# Patient Record
Sex: Male | Born: 1955 | Race: White | Hispanic: No | Smoking: Current every day smoker
Health system: Southern US, Community
[De-identification: ages and names within clinical notes are randomized; demographics above are authoritative.]

## PROBLEM LIST (undated history)

## (undated) DIAGNOSIS — I219 Acute myocardial infarction, unspecified: Secondary | ICD-10-CM

## (undated) DIAGNOSIS — M1990 Unspecified osteoarthritis, unspecified site: Secondary | ICD-10-CM

## (undated) DIAGNOSIS — I10 Essential (primary) hypertension: Secondary | ICD-10-CM

## (undated) DIAGNOSIS — F319 Bipolar disorder, unspecified: Secondary | ICD-10-CM

## (undated) DIAGNOSIS — E039 Hypothyroidism, unspecified: Secondary | ICD-10-CM

## (undated) HISTORY — DX: Essential (primary) hypertension: I10

## (undated) HISTORY — DX: Hypothyroidism, unspecified: E03.9

## (undated) HISTORY — DX: Acute myocardial infarction, unspecified: I21.9

---

## 2008-02-28 ENCOUNTER — Emergency Department: Payer: Self-pay | Admitting: Emergency Medicine

## 2016-01-05 ENCOUNTER — Ambulatory Visit: Payer: Self-pay

## 2016-01-05 ENCOUNTER — Encounter (INDEPENDENT_AMBULATORY_CARE_PROVIDER_SITE_OTHER): Payer: Self-pay

## 2016-01-19 ENCOUNTER — Emergency Department
Admission: EM | Admit: 2016-01-19 | Discharge: 2016-01-19 | Disposition: A | Discharge status: Home / Self Care | Payer: Self-pay | Attending: Emergency Medicine | Admitting: Emergency Medicine

## 2016-01-19 ENCOUNTER — Emergency Department: Payer: Self-pay

## 2016-01-19 ENCOUNTER — Encounter: Payer: Self-pay | Admitting: *Deleted

## 2016-01-19 DIAGNOSIS — Z7982 Long term (current) use of aspirin: Secondary | ICD-10-CM | POA: Insufficient documentation

## 2016-01-19 DIAGNOSIS — Z79899 Other long term (current) drug therapy: Secondary | ICD-10-CM | POA: Insufficient documentation

## 2016-01-19 DIAGNOSIS — F17200 Nicotine dependence, unspecified, uncomplicated: Secondary | ICD-10-CM | POA: Insufficient documentation

## 2016-01-19 DIAGNOSIS — K5900 Constipation, unspecified: Secondary | ICD-10-CM | POA: Insufficient documentation

## 2016-01-19 DIAGNOSIS — R079 Chest pain, unspecified: Secondary | ICD-10-CM | POA: Insufficient documentation

## 2016-01-19 DIAGNOSIS — Z791 Long term (current) use of non-steroidal anti-inflammatories (NSAID): Secondary | ICD-10-CM | POA: Insufficient documentation

## 2016-01-19 HISTORY — DX: Bipolar disorder, unspecified: F31.9

## 2016-01-19 HISTORY — DX: Unspecified osteoarthritis, unspecified site: M19.90

## 2016-01-19 LAB — CBC
HCT: 43.7 % (ref 40.0–52.0)
HEMOGLOBIN: 14.9 g/dL (ref 13.0–18.0)
MCH: 32.2 pg (ref 26.0–34.0)
MCHC: 34.1 g/dL (ref 32.0–36.0)
MCV: 94.6 fL (ref 80.0–100.0)
Platelets: 287 10*3/uL (ref 150–440)
RBC: 4.62 MIL/uL (ref 4.40–5.90)
RDW: 13.1 % (ref 11.5–14.5)
WBC: 7.2 10*3/uL (ref 3.8–10.6)

## 2016-01-19 LAB — BASIC METABOLIC PANEL
ANION GAP: 8 (ref 5–15)
BUN: 13 mg/dL (ref 6–20)
CALCIUM: 9.6 mg/dL (ref 8.9–10.3)
CO2: 28 mmol/L (ref 22–32)
Chloride: 103 mmol/L (ref 101–111)
Creatinine, Ser: 0.83 mg/dL (ref 0.61–1.24)
Glucose, Bld: 102 mg/dL — ABNORMAL HIGH (ref 65–99)
Potassium: 4.5 mmol/L (ref 3.5–5.1)
Sodium: 139 mmol/L (ref 135–145)

## 2016-01-19 LAB — TROPONIN I: TROPONIN I: 0.03 ng/mL (ref <0.031)

## 2016-01-19 MED ORDER — NITROGLYCERIN 0.4 MG SL SUBL
0.4000 mg | SUBLINGUAL_TABLET | SUBLINGUAL | Status: DC | PRN
  Filled 2016-01-19: qty 1

## 2016-01-19 MED ORDER — GI COCKTAIL ~~LOC~~
30.0000 mL | Freq: Once | ORAL | Status: AC
  Administered 2016-01-19: 30 mL via ORAL
  Filled 2016-01-19: qty 30

## 2016-01-19 MED ORDER — DOCUSATE SODIUM 100 MG PO CAPS: 100.0000 mg | ORAL_CAPSULE | Freq: Two times a day (BID) | ORAL | Status: AC

## 2016-01-19 MED ORDER — PANTOPRAZOLE SODIUM 20 MG PO TBEC: 20.0000 mg | DELAYED_RELEASE_TABLET | Freq: Every day | ORAL | Status: DC

## 2016-01-19 NOTE — Discharge Instructions (Signed)
You have been seen in the emergency department today for chest pain. Your workup has shown normal results. As we discussed please follow-up with your primary care physician in the next 1-2 days for recheck. Return to the emergency department for any further chest pain, trouble breathing, or any other symptom personally concerning to yourself. Please call the number provided for cardiology since possible to arrange a stress test.   Nonspecific Chest Pain It is often hard to find the cause of chest pain. There is always a chance that your pain could be related to something serious, such as a heart attack or a blood clot in your lungs. Chest pain can also be caused by conditions that are not life-threatening. If you have chest pain, it is very important to follow up with your doctor.  HOME CARE  If you were prescribed an antibiotic medicine, finish it all even if you start to feel better.  Avoid any activities that cause chest pain.  Do not use any tobacco products, including cigarettes, chewing tobacco, or electronic cigarettes. If you need help quitting, ask your doctor.  Do not drink alcohol.  Take medicines only as told by your doctor.  Keep all follow-up visits as told by your doctor. This is important. This includes any further testing if your chest pain does not go away.  Your doctor may tell you to keep your head raised (elevated) while you sleep.  Make lifestyle changes as told by your doctor. These may include:  Getting regular exercise. Ask your doctor to suggest some activities that are safe for you.  Eating a heart-healthy diet. Your doctor or a diet specialist (dietitian) can help you to learn healthy eating options.  Maintaining a healthy weight.  Managing diabetes, if necessary.  Reducing stress. GET HELP IF:  Your chest pain does not go away, even after treatment.  You have a rash with blisters on your chest.  You have a fever. GET HELP RIGHT AWAY IF:  Your  chest pain is worse.  You have an increasing cough, or you cough up blood.  You have severe belly (abdominal) pain.  You feel extremely weak.  You pass out (faint).  You have chills.  You have sudden, unexplained chest discomfort.  You have sudden, unexplained discomfort in your arms, back, neck, or jaw.  You have shortness of breath at any time.  You suddenly start to sweat, or your skin gets clammy.  You feel nauseous.  You vomit.  You suddenly feel light-headed or dizzy.  Your heart begins to beat quickly, or it feels like it is skipping beats. These symptoms may be an emergency. Do not wait to see if the symptoms will go away. Get medical help right away. Call your local emergency services (911 in the [place] Do not drive yourself to the hospital.   This information is not intended to replace advice given to you by your health care provider. Make sure you discuss any questions you have with your health care provider.   Document Released: 05/15/2008 Document Revised: 12/18/2014 Document Reviewed: 07/03/2014 Elsevier Interactive Patient Education [employer]

## 2016-01-19 NOTE — ED Provider Notes (Addendum)
[facility] Emergency Department Provider Note  Time seen: 6:18 PM  I have reviewed the triage vital signs and the nursing notes.   HISTORY  Chief Complaint Chest Pain    HPI Wayne Roy is a 60 y.o. male the past medical history of arthritis and bipolar presents the emergency department with chest discomfort. According to the patient for the past one month he has been having chest discomfort somewhat worse with exertion such as going up a flight of stairs. Patient is also complaining of some constipation, states he has been taking MiraLAX since getting out of prison approximately one month ago but continues to have constipation at times. Patient is currently working on Museum/gallery curator and does not have the money to see a cardiologist. Patient has arrange a primary care appointment with [facility]. He is back on his medications and being seen by medical management for medication refills. Describes chest discomfort as 0/10 currently, however he states upon exertion he will get moderate chest discomfort/tightness which tends to resolve with rest, sometimes within minutes, sometimes several hours. Denies any pain currently.     Past Medical History  Diagnosis Date  . Arthritis   . Bipolar 1 disorder (HCC)     There are no active problems to display for this patient.   History reviewed. No pertinent past surgical history.  Current Outpatient Rx  Name  Route  Sig  Dispense  Refill  . acetaminophen (TYLENOL) 500 MG tablet   Oral   Take 1,000 mg by mouth every 6 (six) hours as needed for mild pain or headache.         Marland Kitchen aspirin EC 81 MG tablet   Oral   Take 81 mg by mouth daily.         . busPIRone (BUSPAR) 15 MG tablet   Oral   Take 30 mg by mouth 2 (two) times daily.         . carbamazepine (TEGRETOL) 200 MG tablet   Oral   Take 400-600 mg by mouth 2 (two) times daily. Pt takes two tablets every morning and three tablets at  bedtime.         Marland Kitchen gemfibrozil (LOPID) 600 MG tablet   Oral   Take 600 mg by mouth 2 (two) times daily before a meal.         . levothyroxine (SYNTHROID, LEVOTHROID) 50 MCG tablet   Oral   Take 50 mcg by mouth daily before breakfast.         . meloxicam (MOBIC) 15 MG tablet   Oral   Take 15 mg by mouth daily.         . mirtazapine (REMERON) 30 MG tablet   Oral   Take 30 mg by mouth at bedtime.         . polyethylene glycol (MIRALAX / GLYCOLAX) packet   Oral   Take 17 g by mouth daily as needed for mild constipation.         . ranitidine (ZANTAC) 150 MG tablet   Oral   Take 300 mg by mouth 2 (two) times daily.         Marland Kitchen venlafaxine XR (EFFEXOR-XR) 150 MG 24 hr capsule   Oral   Take 300 mg by mouth daily with breakfast.           Allergies Saphris  History reviewed. No pertinent family history.  Social History Social History  Substance Use Topics  . Smoking  status: Current Every Day Smoker  . Smokeless tobacco: None  . Alcohol Use: None    Review of Systems Constitutional: Negative for fever. Cardiovascular: Positive for chest pain, currently resolved Respiratory: Negative for shortness of breath. Gastrointestinal: Negative for abdominal pain. Positive for constipation. Musculoskeletal: Negative for back pain. Neurological: Negative for headache 10-point ROS otherwise negative.  ____________________________________________   PHYSICAL EXAM:  VITAL SIGNS: ED Triage Vitals  Enc Vitals Group     BP 01/19/16 1406 163/107 mmHg     Pulse Rate 01/19/16 1406 111     Resp 01/19/16 1406 18     Temp 01/19/16 1406 98 F (36.7 C)     Temp Source 01/19/16 1406 Oral     SpO2 01/19/16 1406 96 %     Weight 01/19/16 1406 228 lb (103.42 kg)     Height 01/19/16 1406  (1.778 m)     Head Cir --      Peak Flow --      Pain Score 01/19/16 1407 7     Pain Loc --      Pain Edu? --      Excl. in GC? --     Constitutional: Alert and oriented. Well  appearing and in no distress. Eyes: Normal exam ENT   Head: Normocephalic and atraumatic.   Mouth/Throat: Mucous membranes are moist. Cardiovascular: Normal rate, regular rhythm. No murmur Respiratory: Normal respiratory effort without tachypnea nor retractions. Breath sounds are clear Gastrointestinal: Soft and nontender. No distention.  Musculoskeletal: Nontender with normal range of motion in all extremities.  Neurologic:  Normal speech and language. No gross focal neurologic deficits Skin:  Skin is warm, dry and intact.  Psychiatric: Mood and affect are normal. Speech and behavior are normal.  ____________________________________________    EKG  EKG shows sinus tachycardia 104 bpm, narrow QS, normal axis, normal intervals, no ST changes. Normal EKG besides mild tachycardia.  ____________________________________________    RADIOLOGY  Chest x-ray shows no acute abnormality  ____________________________________________    INITIAL IMPRESSION / ASSESSMENT AND PLAN / ED COURSE  Pertinent labs & imaging results that were available during my care of the patient were reviewed by me and considered in my medical decision making (see chart for details).  Patient presents with chest discomfort intermittent for the past one month. Patient's labs are within normal limits, EKG is reassuring, chest x-ray is normal. Patient states it does feel like heartburn, and since leaving prison he states he has not been on any heartburn medications. We will treat with a GI cocktail in the emergency department. He also states during my questioning that the pain is somewhat worse with exertion. I discussed with the patient I would strongly recommend he get a stress test, I will refer him to a cardiologist, he states he is expecting to get his Medicaid approved any day now and he will follow-up as soon as he does. I urged the patient to do this as soon as possible even if he does not have insurance.  Patient is currently back on HIS medications which he had been off of since being discharged from prison. Patient states he is feeling better status post GI cocktail. We'll discharge the patient with primary care follow-up at [facility], and cardiology follow-up.   EKG reviewed and interpreted by myself shows sinus tachycardia 104 bpm. Narrow QRS, normal axis, normal intervals, no ST changes. ____________________________________________   FINAL CLINICAL IMPRESSION(S) / ED DIAGNOSES  Chest pain   Minna Antis, MD 01/19/16  [id number]  Minna Antis, MD 02/04/16 [phone]

## 2016-01-19 NOTE — ED Notes (Signed)
Pt states chest tightness on and off for the past few months, states he noticed his BP was high today so he came for evaluation, pt states SOB, mid chest

## 2016-01-20 ENCOUNTER — Encounter: Payer: Self-pay | Admitting: Pharmacist

## 2016-01-20 ENCOUNTER — Ambulatory Visit: Payer: Self-pay

## 2016-01-20 DIAGNOSIS — I10 Essential (primary) hypertension: Secondary | ICD-10-CM | POA: Insufficient documentation

## 2016-01-20 DIAGNOSIS — R079 Chest pain, unspecified: Secondary | ICD-10-CM | POA: Insufficient documentation

## 2016-01-20 DIAGNOSIS — E039 Hypothyroidism, unspecified: Secondary | ICD-10-CM | POA: Insufficient documentation

## 2016-01-20 DIAGNOSIS — E785 Hyperlipidemia, unspecified: Secondary | ICD-10-CM | POA: Insufficient documentation

## 2016-01-28 DIAGNOSIS — R079 Chest pain, unspecified: Secondary | ICD-10-CM

## 2016-01-28 DIAGNOSIS — I10 Essential (primary) hypertension: Secondary | ICD-10-CM

## 2016-01-28 DIAGNOSIS — E785 Hyperlipidemia, unspecified: Secondary | ICD-10-CM

## 2016-01-28 DIAGNOSIS — E039 Hypothyroidism, unspecified: Secondary | ICD-10-CM

## 2016-02-08 ENCOUNTER — Telehealth: Payer: Self-pay

## 2016-02-08 NOTE — Telephone Encounter (Signed)
Pt called to check on paperwork for [facility] referral

## 2016-02-17 ENCOUNTER — Other Ambulatory Visit: Payer: Self-pay

## 2016-02-17 DIAGNOSIS — E039 Hypothyroidism, unspecified: Secondary | ICD-10-CM

## 2016-02-17 DIAGNOSIS — I10 Essential (primary) hypertension: Secondary | ICD-10-CM

## 2016-02-18 LAB — CBC WITH DIFFERENTIAL/PLATELET
BASOS ABS: 0 10*3/uL (ref 0.0–0.2)
Basos: 0 %
EOS (ABSOLUTE): 0.3 10*3/uL (ref 0.0–0.4)
Eos: 3 %
Hematocrit: 40.4 % (ref 37.5–51.0)
Hemoglobin: 13.9 g/dL (ref 12.6–17.7)
IMMATURE GRANULOCYTES: 0 %
Immature Grans (Abs): 0 10*3/uL (ref 0.0–0.1)
Lymphocytes Absolute: 2.1 10*3/uL (ref 0.7–3.1)
Lymphs: 23 %
MCH: 32.4 pg (ref 26.6–33.0)
MCHC: 34.4 g/dL (ref 31.5–35.7)
MCV: 94 fL (ref 79–97)
MONOS ABS: 0.6 10*3/uL (ref 0.1–0.9)
Monocytes: 7 %
NEUTROS PCT: 67 %
Neutrophils Absolute: 6.2 10*3/uL (ref 1.4–7.0)
PLATELETS: 309 10*3/uL (ref 150–379)
RBC: 4.29 x10E6/uL (ref 4.14–5.80)
RDW: 13.7 % (ref 12.3–15.4)
WBC: 9.3 10*3/uL (ref 3.4–10.8)

## 2016-02-18 LAB — COMPREHENSIVE METABOLIC PANEL
ALK PHOS: 68 IU/L (ref 39–117)
ALT: 42 IU/L (ref 0–44)
AST: 32 IU/L (ref 0–40)
Albumin/Globulin Ratio: 1.9 (ref 1.1–2.5)
Albumin: 4.7 g/dL (ref 3.5–5.5)
BUN/Creatinine Ratio: 13 (ref 9–20)
BUN: 12 mg/dL (ref 6–24)
Bilirubin Total: 0.5 mg/dL (ref 0.0–1.2)
CO2: 22 mmol/L (ref 18–29)
CREATININE: 0.96 mg/dL (ref 0.76–1.27)
Calcium: 9.4 mg/dL (ref 8.7–10.2)
Chloride: 101 mmol/L (ref 96–106)
GFR calc Af Amer: 101 mL/min/{1.73_m2} (ref >59)
GFR calc non Af Amer: 87 mL/min/{1.73_m2} (ref >59)
GLOBULIN, TOTAL: 2.5 g/dL (ref 1.5–4.5)
GLUCOSE: 105 mg/dL — AB (ref 65–99)
Potassium: 4.3 mmol/L (ref 3.5–5.2)
SODIUM: 140 mmol/L (ref 134–144)
Total Protein: 7.2 g/dL (ref 6.0–8.5)

## 2016-02-18 LAB — LIPID PANEL
CHOL/HDL RATIO: 4.1 ratio (ref 0.0–5.0)
CHOLESTEROL TOTAL: 180 mg/dL (ref 100–199)
HDL: 44 mg/dL (ref >39)
LDL Calculated: 102 mg/dL — ABNORMAL HIGH (ref 0–99)
TRIGLYCERIDES: 172 mg/dL — AB (ref 0–149)
VLDL Cholesterol Cal: 34 mg/dL (ref 5–40)

## 2016-02-18 LAB — TSH: TSH: 0.243 u[IU]/mL — AB (ref 0.450–4.500)

## 2016-02-22 NOTE — Telephone Encounter (Signed)
Tried to call pt to confirm that [facility] [phone]) referral was taken care of. Confirmed with Lorrie that referral was complete. Closing encounter.

## 2016-02-24 ENCOUNTER — Ambulatory Visit: Payer: Self-pay | Admitting: Nurse Practitioner

## 2016-02-24 VITALS — BP 122/77 | HR 95 | Ht 70.0 in | Wt 220.0 lb

## 2016-02-24 DIAGNOSIS — E038 Other specified hypothyroidism: Secondary | ICD-10-CM

## 2016-02-24 DIAGNOSIS — K219 Gastro-esophageal reflux disease without esophagitis: Secondary | ICD-10-CM

## 2016-02-24 DIAGNOSIS — M5442 Lumbago with sciatica, left side: Secondary | ICD-10-CM

## 2016-02-24 DIAGNOSIS — R7309 Other abnormal glucose: Secondary | ICD-10-CM

## 2016-02-24 MED ORDER — RANITIDINE HCL 150 MG PO TABS: 150.0000 mg | ORAL_TABLET | Freq: Two times a day (BID) | ORAL | Status: DC

## 2016-02-24 MED ORDER — FENOFIBRATE 145 MG PO TABS: 145.0000 mg | ORAL_TABLET | Freq: Every day | ORAL | Status: DC

## 2016-02-24 MED ORDER — LEVOTHYROXINE SODIUM 25 MCG PO TABS: ORAL_TABLET | ORAL | Status: DC

## 2016-02-24 NOTE — Progress Notes (Signed)
   Subjective:    Patient ID: Wayne Roy, male    DOB: 12/18/1955, 60 y.o.   MRN: [id number]  HPI rHere today for follow of hypothyroidism, last tsh was lower than acceptable limit, pt asymtpomatic.    Lower back pain, with radiation  Down L leg, prednisone causes mania in this pt r/t bipolar I, on meloxicam reports inadequate response  Gerd, protonix two tabs per day inadequate response, drinks 2 cups of coffee per day and diet mountain dew.    Last labs, blood glucose at 175, no past history of diabetes.     Review of Systems  Constitutional: Negative for fever, fatigue and unexpected weight change.  Respiratory: Positive for shortness of breath.   Gastrointestinal:       Positive for gerd  Musculoskeletal: Positive for back pain.       Objective:   Physical Exam  Constitutional: He is oriented to person, place, and time.  Cardiovascular: Regular rhythm and normal heart sounds.   Mildly tachycardic, heart rate in 90's to approx 11o No carotid bruits  Pulmonary/Chest: Effort normal and breath sounds normal. He has no wheezes. He has no rales.  Musculoskeletal: He exhibits no edema.  Neurological: He is alert and oriented to person, place, and time.  Psychiatric: His behavior is normal.          Assessment & Plan:     Wayne Roy was seen today for gastroesophageal reflux.  Diagnoses and all orders for this visit:  Other specified hypothyroidism -     TSH; Future  Other abnormal glucose -     HgB A1c; Future  Left-sided low back pain with left-sided sciatica  GERD without esophagitis  Other orders -     fenofibrate (TRICOR) 145 MG tablet; Take 1 tablet (145 mg total) by mouth daily. -     ranitidine (ZANTAC) 150 MG tablet; Take 1 tablet (150 mg total) by mouth 2 (two) times daily.

## 2016-02-25 ENCOUNTER — Telehealth: Payer: Self-pay

## 2016-02-25 NOTE — Telephone Encounter (Signed)
[facility] 25 mcg take 1.5 tablets daily quantity #45. #45 would be a 90 day supply if taking 1/2 tablet daily or it would be a 30 day supply if taking 1.5 daily. [facility] would like verification on the instructions and supply.

## 2016-03-01 ENCOUNTER — Ambulatory Visit: Payer: Self-pay | Admitting: Specialist

## 2016-03-01 DIAGNOSIS — M4807 Spinal stenosis, lumbosacral region: Secondary | ICD-10-CM

## 2016-03-17 NOTE — Progress Notes (Signed)
See paper order under media tab

## 2016-03-23 ENCOUNTER — Ambulatory Visit
Admission: RE | Admit: 2016-03-23 | Discharge: 2016-03-23 | Disposition: A | Discharge status: Home / Self Care | Payer: Self-pay | Source: Ambulatory Visit | Attending: Specialist | Admitting: Specialist

## 2016-03-23 ENCOUNTER — Other Ambulatory Visit: Payer: Self-pay

## 2016-03-23 DIAGNOSIS — M4807 Spinal stenosis, lumbosacral region: Secondary | ICD-10-CM | POA: Insufficient documentation

## 2016-03-23 DIAGNOSIS — M5136 Other intervertebral disc degeneration, lumbar region: Secondary | ICD-10-CM | POA: Insufficient documentation

## 2016-03-23 DIAGNOSIS — E038 Other specified hypothyroidism: Secondary | ICD-10-CM

## 2016-03-23 DIAGNOSIS — M1288 Other specific arthropathies, not elsewhere classified, other specified site: Secondary | ICD-10-CM | POA: Insufficient documentation

## 2016-03-23 DIAGNOSIS — R7309 Other abnormal glucose: Secondary | ICD-10-CM

## 2016-03-24 LAB — HEMOGLOBIN A1C
ESTIMATED AVERAGE GLUCOSE: 114 mg/dL
HEMOGLOBIN A1C: 5.6 % (ref 4.8–5.6)

## 2016-03-24 LAB — TSH: TSH: 0.869 u[IU]/mL (ref 0.450–4.500)

## 2016-03-29 ENCOUNTER — Encounter: Payer: Self-pay | Admitting: Specialist

## 2016-03-30 DIAGNOSIS — F17200 Nicotine dependence, unspecified, uncomplicated: Secondary | ICD-10-CM | POA: Insufficient documentation

## 2016-05-25 ENCOUNTER — Ambulatory Visit: Payer: Self-pay

## 2016-08-14 IMAGING — MR MR LUMBAR SPINE W/O CM
4 of 5 series · 15 of 48 positions shown · non-contrast
Comparison: None.

CLINICAL DATA: Low back pain extends to the LEFT leg for 10 years.
Worsening over time.

EXAM:
MRI LUMBAR SPINE WITHOUT CONTRAST
TECHNIQUE: Multiplanar, multisequence MR imaging of the lumbar spine was
performed. No intravenous contrast was administered.

[Series 2: T2 · sagittal · 4.0mm · 0.44mm/px · 6 of 16 slices shown (1 of 2)]
[im 1/16]
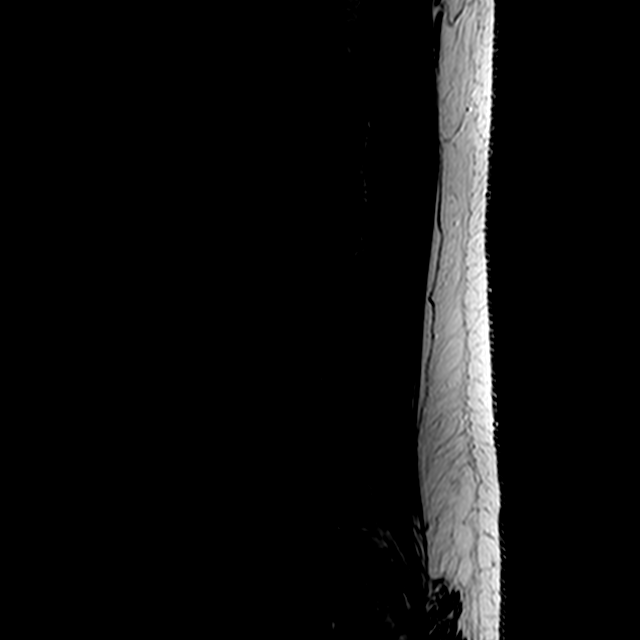
[im 4/16]
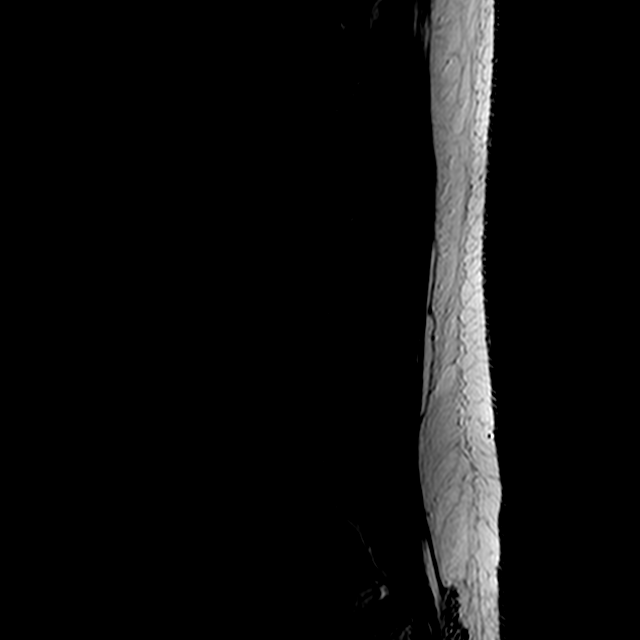
[im 7/16]
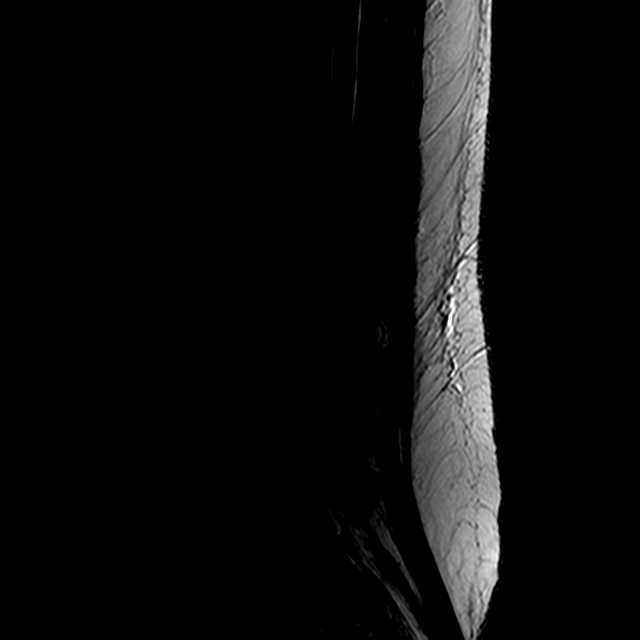
[im 10/16]
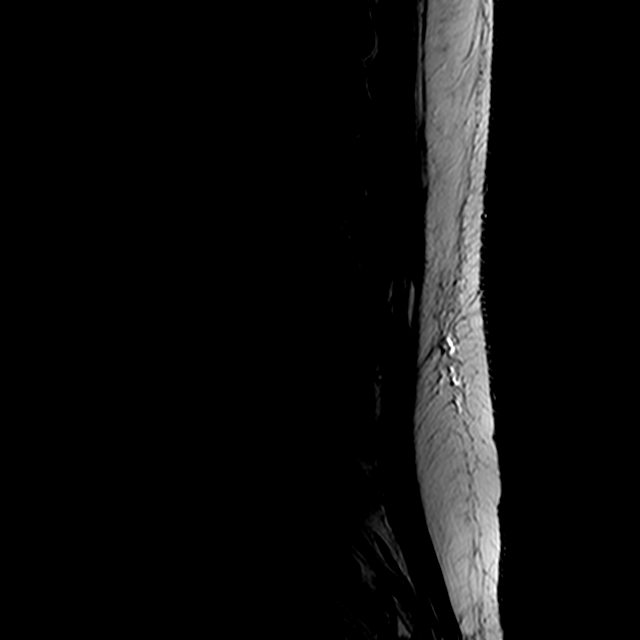
[im 13/16]
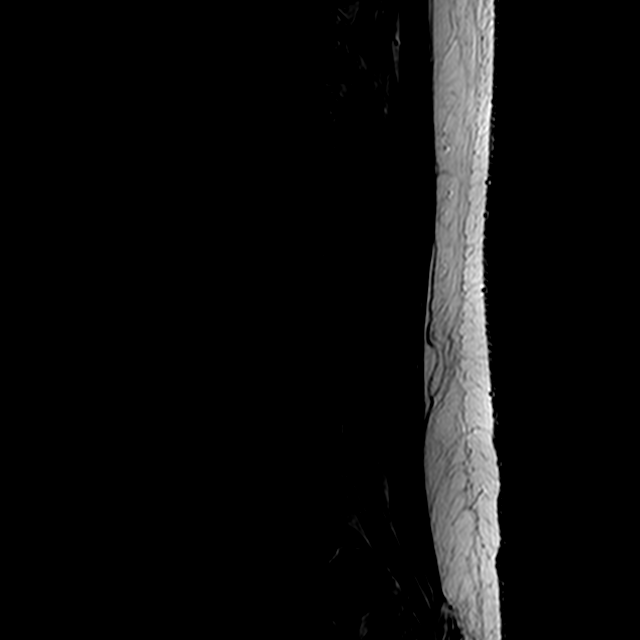
[im 16/16]
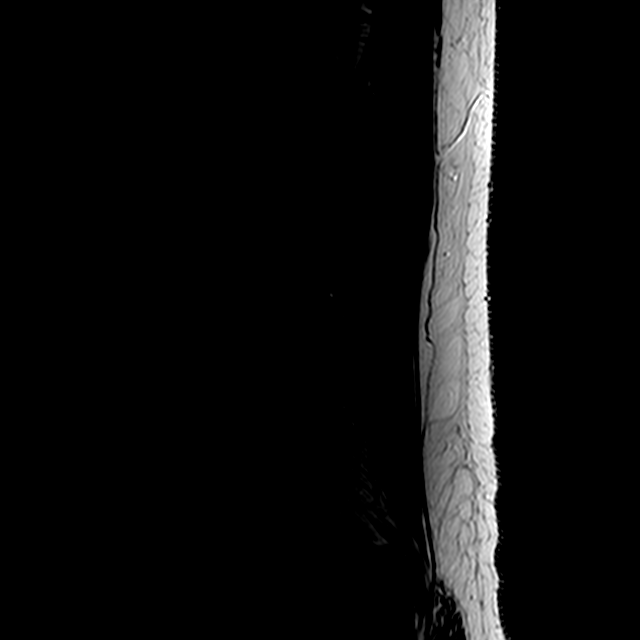

[Series 3: T1 · sagittal · 4.0mm · 0.44mm/px · 3 of 16 slices shown (1 of 2)]
[im 4/16]
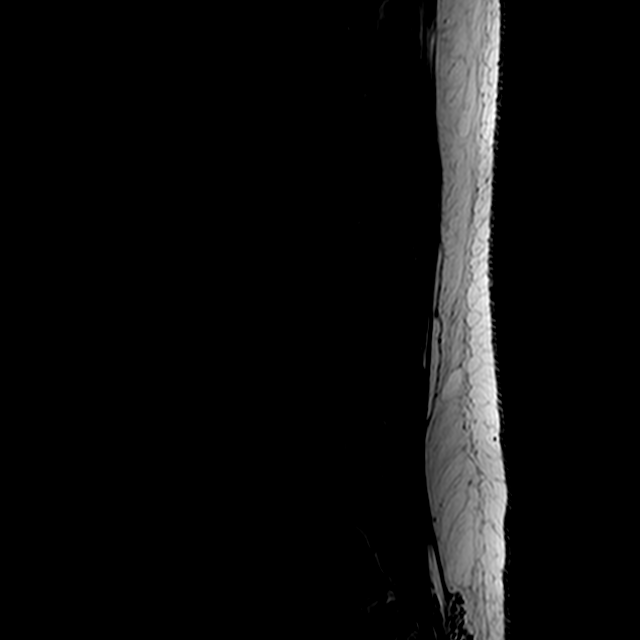
[im 10/16]
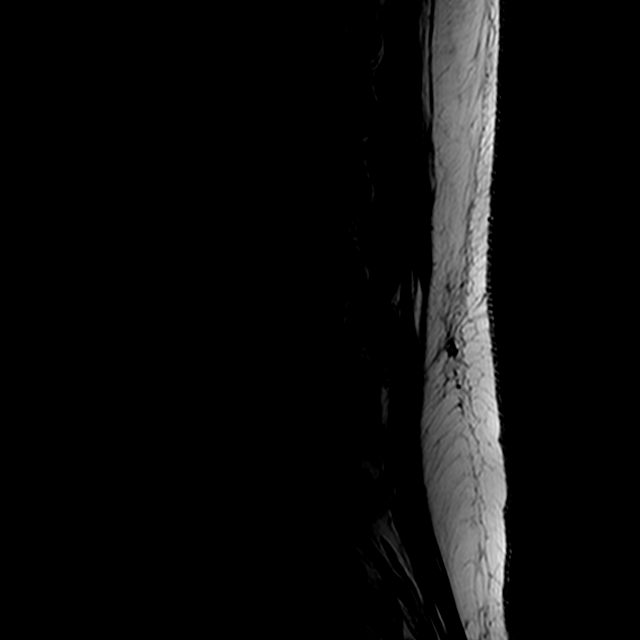
[im 16/16]
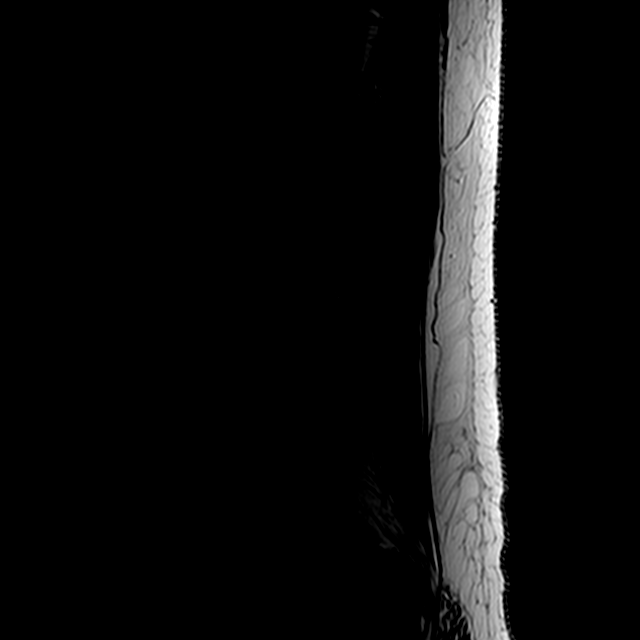

[Series 5: T2 · axial · 4.0mm · 0.39mm/px · z∈[-88,+69]mm · 3 of 39 slices shown (2 of 2)]
[im 6/39]
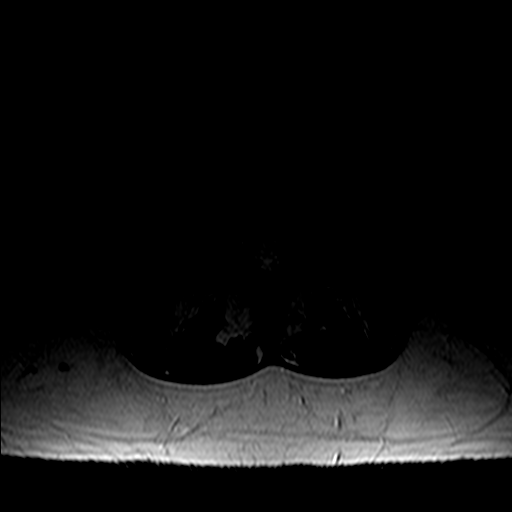
[im 20/39]
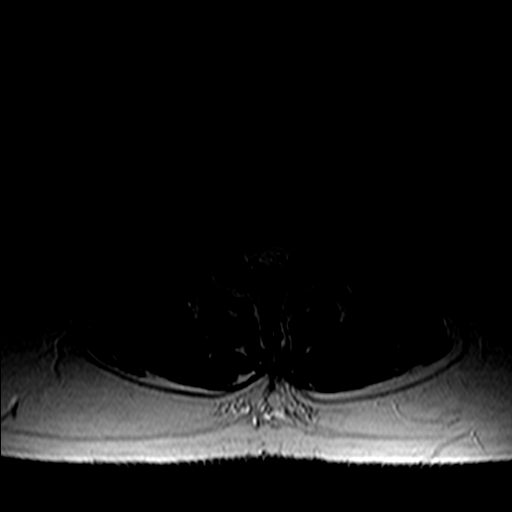
[im 33/39]
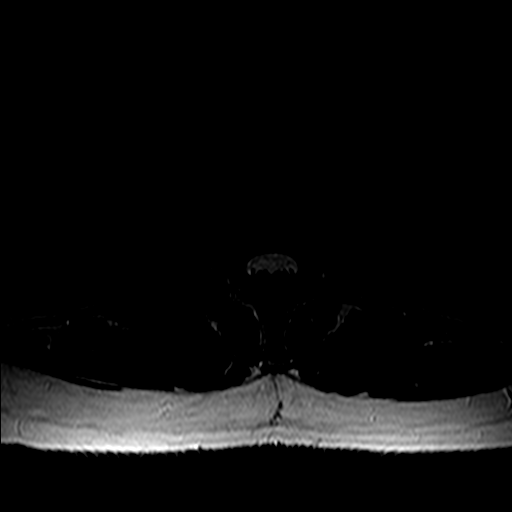

[Series 6: T1 · axial · 4.0mm · 0.39mm/px · z∈[-88,+69]mm · 3 of 39 slices shown (2 of 2)]
[im 6/39]
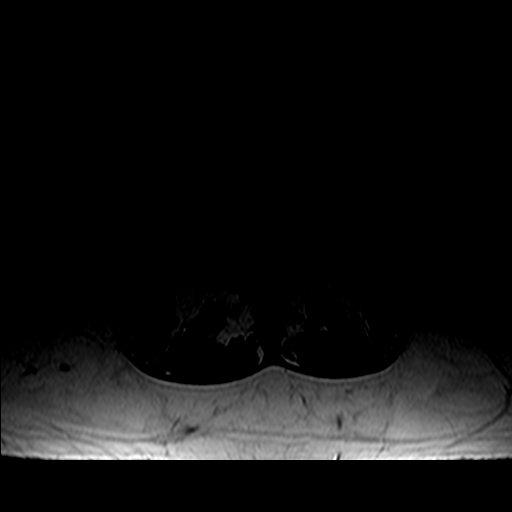
[im 20/39]
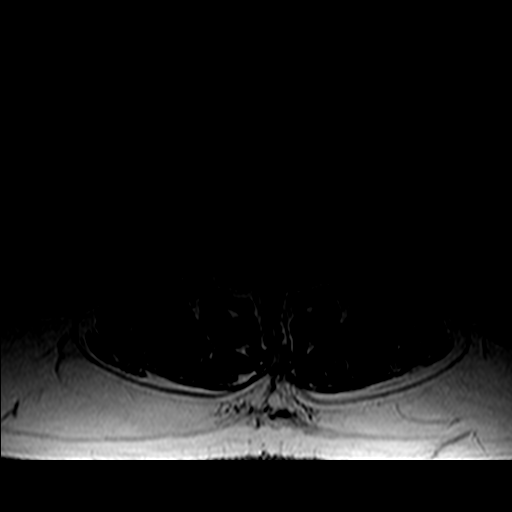
[im 33/39]
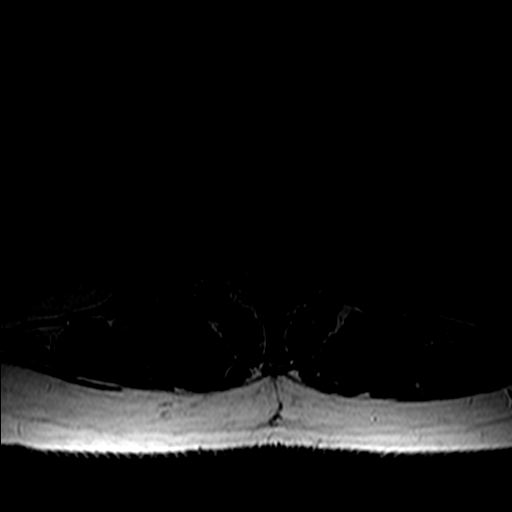

[15 of 48 positions shown; findings below may reference images not displayed]

FINDINGS: Segmentation: Normal.

Alignment:  Normal.

Vertebrae: No worrisome osseous lesion.

Conus medullaris: Normal in size, signal, and location.

Paraspinal tissues: No evidence for hydronephrosis or paravertebral
mass.

Disc levels:

L1-L2:  Mild bulge.  No impingement.

L2-L3:  Normal.

L3-L4:  Normal.

L4-L5:  Mild bulge.  Mild facet arthropathy.  No impingement.

L5-S1: Small annular rent with tiny subligamentous protrusion. Mild
facet arthropathy. No impingement.
IMPRESSION: Minor lumbar degenerative disc disease and facet arthropathy. No
compressive lesion contributing to LEFT leg pain is observed.

## 2016-10-10 ENCOUNTER — Ambulatory Visit: Payer: Self-pay | Admitting: Nurse Practitioner

## 2016-10-10 VITALS — BP 139/92 | HR 91 | Temp 98.1°F | Ht 70.0 in | Wt 227.0 lb

## 2016-10-10 DIAGNOSIS — I10 Essential (primary) hypertension: Secondary | ICD-10-CM

## 2016-10-10 DIAGNOSIS — M1990 Unspecified osteoarthritis, unspecified site: Secondary | ICD-10-CM

## 2016-10-10 DIAGNOSIS — E782 Mixed hyperlipidemia: Secondary | ICD-10-CM

## 2016-10-10 DIAGNOSIS — E038 Other specified hypothyroidism: Secondary | ICD-10-CM

## 2016-10-10 DIAGNOSIS — R7309 Other abnormal glucose: Secondary | ICD-10-CM

## 2016-10-10 MED ORDER — NAPROXEN 500 MG PO TABS: 500.0000 mg | ORAL_TABLET | Freq: Two times a day (BID) | ORAL | 2 refills | Status: DC

## 2016-10-10 MED ORDER — CYCLOBENZAPRINE HCL 10 MG PO TABS: 10.0000 mg | ORAL_TABLET | Freq: Three times a day (TID) | ORAL | 0 refills | Status: DC

## 2016-10-10 MED ORDER — LEVOTHYROXINE SODIUM 25 MCG PO TABS: ORAL_TABLET | ORAL | 1 refills | Status: DC

## 2016-10-10 MED ORDER — DICLOFENAC SODIUM 25 MG PO TBEC: 50.0000 mg | DELAYED_RELEASE_TABLET | Freq: Two times a day (BID) | ORAL | Status: DC

## 2016-10-10 NOTE — Progress Notes (Signed)
Here today 1st visit since march 2017, has not had any labs, is on levothyroxizine but not checked since April 17.    Requesting change in meds from meloxicam, states not managing his pain.    Has been out of all his meds for several months  Smokes approx 15  Cigarettes per day ETOH:  Rare No substance use  Exam: Alert, verbally appropriate, in no acute distress, no thyromegaly or adnopathy No carotid bruits BBrS clear AP in high 90's, RRR No BLE edema  Assess/Plan:   Will stop meloxicam and begin naproxen at 500 mg bid Will begin  Flexeril 10 mg tid, for muscle spasms as needed.    Will draw all health maintenance lab, and review at next visit  Pt has been out of levothyroxine, will restart levothyroxine at 37.5 mcg, and recheck response in one month, at that point will be able to determine if dosage change is necessary  Elevated bp, was on clonidine in past, has not taken for a significant period of time, will not restart, as heart rate is 91 and bp 139/92. Will continue to monitor, and address as indicated.      Pt to continue follow up for bipolar with mental health

## 2016-10-11 LAB — COMPREHENSIVE METABOLIC PANEL
ALK PHOS: 66 IU/L (ref 39–117)
ALT: 34 IU/L (ref 0–44)
AST: 22 IU/L (ref 0–40)
Albumin/Globulin Ratio: 1.7 (ref 1.2–2.2)
Albumin: 4.7 g/dL (ref 3.5–5.5)
BUN/Creatinine Ratio: 12 (ref 9–20)
BUN: 12 mg/dL (ref 6–24)
Bilirubin Total: 0.3 mg/dL (ref 0.0–1.2)
CO2: 20 mmol/L (ref 18–29)
CREATININE: 1.01 mg/dL (ref 0.76–1.27)
Calcium: 9.6 mg/dL (ref 8.7–10.2)
Chloride: 99 mmol/L (ref 96–106)
GFR calc Af Amer: 94 mL/min/{1.73_m2} (ref >59)
GFR calc non Af Amer: 82 mL/min/{1.73_m2} (ref >59)
GLOBULIN, TOTAL: 2.7 g/dL (ref 1.5–4.5)
GLUCOSE: 126 mg/dL — AB (ref 65–99)
Potassium: 3.6 mmol/L (ref 3.5–5.2)
SODIUM: 139 mmol/L (ref 134–144)
Total Protein: 7.4 g/dL (ref 6.0–8.5)

## 2016-10-11 LAB — LIPID PANEL
CHOL/HDL RATIO: 5.1 ratio — AB (ref 0.0–5.0)
CHOLESTEROL TOTAL: 202 mg/dL — AB (ref 100–199)
HDL: 40 mg/dL (ref >39)
LDL CALC: 113 mg/dL — AB (ref 0–99)
Triglycerides: 247 mg/dL — ABNORMAL HIGH (ref 0–149)
VLDL CHOLESTEROL CAL: 49 mg/dL — AB (ref 5–40)

## 2016-10-11 LAB — HEMOGLOBIN A1C
ESTIMATED AVERAGE GLUCOSE: 105 mg/dL
HEMOGLOBIN A1C: 5.3 % (ref 4.8–5.6)

## 2016-10-11 LAB — TSH: TSH: 3.12 u[IU]/mL (ref 0.450–4.500)

## 2016-10-13 ENCOUNTER — Telehealth: Payer: Self-pay

## 2016-10-13 NOTE — Telephone Encounter (Signed)
Called pt and gave doctor's note and results. Pt verbalized understanding.

## 2016-10-13 NOTE — Telephone Encounter (Signed)
-----   Message from Harle BattiestShannon A McGowan, PA-C sent at 10/12/2016 10:04 PM EDT ----- Patient's cholesterol and triglycerides are mildly elevated. I encouraged the patient to continue his TriCor medication.  I would also encourage the patient to stop smoking, lose weight, eat 5 servings of vegetables daily, engage in 30 minutes of aerobic exercise 4 days weekly and consume less results in an effort to keep his blood pressure low.

## 2016-10-17 ENCOUNTER — Other Ambulatory Visit: Payer: Self-pay

## 2016-10-18 ENCOUNTER — Ambulatory Visit: Payer: Self-pay | Admitting: Ophthalmology

## 2016-10-24 ENCOUNTER — Ambulatory Visit: Payer: Self-pay | Admitting: Family Medicine

## 2016-10-24 ENCOUNTER — Telehealth: Payer: Self-pay

## 2016-10-24 VITALS — BP 112/78 | HR 79 | Temp 97.5°F | Wt 227.0 lb

## 2016-10-24 DIAGNOSIS — I10 Essential (primary) hypertension: Secondary | ICD-10-CM

## 2016-10-24 DIAGNOSIS — E782 Mixed hyperlipidemia: Secondary | ICD-10-CM

## 2016-10-24 DIAGNOSIS — F3170 Bipolar disorder, currently in remission, most recent episode unspecified: Secondary | ICD-10-CM

## 2016-10-24 DIAGNOSIS — E039 Hypothyroidism, unspecified: Secondary | ICD-10-CM

## 2016-10-24 DIAGNOSIS — F319 Bipolar disorder, unspecified: Secondary | ICD-10-CM | POA: Insufficient documentation

## 2016-10-24 MED ORDER — FENOFIBRATE 145 MG PO TABS: 145.0000 mg | ORAL_TABLET | Freq: Every day | ORAL | 3 refills | Status: DC

## 2016-10-24 MED ORDER — CYCLOBENZAPRINE HCL 10 MG PO TABS: 10.0000 mg | ORAL_TABLET | Freq: Two times a day (BID) | ORAL | 5 refills | Status: DC | PRN

## 2016-10-24 NOTE — Progress Notes (Signed)
Primary Care Progress Note  Patient: Wayne BumpRodney A Mccampbell Male    DOB: 09/08/1956   60 y.o.   MRN: [id number] Visit Date: 10/24/2016  Today's Provider: Mila Merryonald Tyron Manetta, MD  Chief Complaint  Patient presents with  . Follow-up   Subjective:    HPI Follow up hypothyroid Had labs done last visit 10-10-16 when he had been out of levothyroid for a few months when labs done, but now back on thyroid replacement.  Lab Results  Component Value Date   TSH 3.120 10/10/2016    Follow up dyslipidemia Tolerating fenofibrate well and taking consistently Lab Results  Component Value Date   CHOL 202 (H) 10/10/2016   HDL 40 10/10/2016   LDLCALC 113 (H) 10/10/2016   TRIG 247 (H) 10/10/2016   CHOLHDL 5.1 (H) 10/10/2016    Follow up chronic back pain Feels that Flexeril is working much better than Meloxicam prescribed in the past. Works at [employer] sitting down most of day. Usually takes 2 Flexeril each day.   States has had a lot of trouble with constipation for years on current psychiatric medications. Miralax isn't helping Colace isn't helping. Has been on metamucil in the past which didn't work.     Allergies  Allergen Reactions  . Saphris [Asenapine] Other (See Comments)    Other reaction(s): Other (See Comments) Reaction:Sores in pts mouth, nose, and ears Reaction:  Sores in pts mouth, nose, and ears   Previous Medications   ACETAMINOPHEN (TYLENOL) 500 MG TABLET    Take 1,000 mg by mouth every 6 (six) hours as needed for mild pain or headache.   ARIPIPRAZOLE (ABILIFY) 10 MG TABLET    Take 10 mg by mouth daily.   ASPIRIN EC 81 MG TABLET    Take 81 mg by mouth daily.   BUSPIRONE (BUSPAR) 15 MG TABLET    Take 30 mg by mouth 2 (two) times daily.   CYCLOBENZAPRINE (FLEXERIL) 10 MG TABLET    Take 1 tablet (10 mg total) by mouth 3 (three) times daily.   DOCUSATE SODIUM (COLACE) 100 MG CAPSULE    Take 1 capsule (100 mg total) by mouth 2 (two) times daily.   FENOFIBRATE (TRICOR) 145 MG  TABLET    Take 1 tablet (145 mg total) by mouth daily.   LEVOTHYROXINE (SYNTHROID, LEVOTHROID) 25 MCG TABLET    1.5 tables by mouth each day   MIRTAZAPINE (REMERON) 30 MG TABLET    Take 30 mg by mouth at bedtime. Reported on 02/24/2016   NAPROXEN (NAPROSYN) 500 MG TABLET    Take 1 tablet (500 mg total) by mouth 2 (two) times daily with a meal.   PANTOPRAZOLE (PROTONIX) 20 MG TABLET    Take 1 tablet (20 mg total) by mouth daily.   POLYETHYLENE GLYCOL (MIRALAX / GLYCOLAX) PACKET    Take 17 g by mouth daily as needed for mild constipation.   RANITIDINE (ZANTAC) 150 MG TABLET    Take 1 tablet (150 mg total) by mouth 2 (two) times daily.   VENLAFAXINE XR (EFFEXOR-XR) 150 MG 24 HR CAPSULE    Take 300 mg by mouth daily with breakfast.    Review of Systems  Social History  Substance Use Topics  . Smoking status: Current Every Day Smoker    Packs/day: 0.50    Years: 45.00  . Smokeless tobacco: Never Used  . Alcohol use No   Objective:   BP 112/78   Pulse 79   Temp 97.5 F (36.4 C)  Wt 227 lb (103 kg)   BMI 32.57 kg/m   Physical Exam  General Appearance:    Alert, cooperative, no distress, obese  Eyes:    PERRL, conjunctiva/corneas clear, EOM's intact       Lungs:     Clear to auscultation bilaterally, respirations unlabored  Heart:    Regular rate and rhythm  Neurologic:   Awake, alert, oriented x 3. No apparent focal neurological           defect.           Assessment & Plan:     1. Bipolar disorder in full remission, most recent episode unspecified type [facility](HCC) Doing well on [facility] medications [facility]  2. Mixed hyperlipidemia Doing well on fenofibrate  3. Hypothyroidism, unspecified type Feels better since starting back on levothyroxine. Return in 3 months, labs 1 week before o.v.  - T4 AND TSH; Future  4. Essential hypertension Currently off of antihypertensives. Continue to monitor.        Mila Merryonald Dmoni Fortson, MD

## 2016-10-24 NOTE — Telephone Encounter (Signed)
Called pt to reschedule eye appt on the 22nd of NOV to another date. Left msg.

## 2016-11-01 ENCOUNTER — Ambulatory Visit: Payer: Self-pay | Admitting: Ophthalmology

## 2016-11-08 ENCOUNTER — Ambulatory Visit: Payer: Self-pay | Admitting: Ophthalmology

## 2016-11-28 ENCOUNTER — Other Ambulatory Visit: Payer: Self-pay

## 2016-11-28 DIAGNOSIS — K219 Gastro-esophageal reflux disease without esophagitis: Secondary | ICD-10-CM

## 2016-11-28 DIAGNOSIS — E039 Hypothyroidism, unspecified: Secondary | ICD-10-CM

## 2016-11-28 MED ORDER — PANTOPRAZOLE SODIUM 20 MG PO TBEC: 20.0000 mg | DELAYED_RELEASE_TABLET | Freq: Every day | ORAL | 1 refills | Status: DC

## 2016-11-28 MED ORDER — LEVOTHYROXINE SODIUM 25 MCG PO TABS: ORAL_TABLET | ORAL | 6 refills | Status: DC

## 2016-11-28 MED ORDER — RANITIDINE HCL 150 MG PO TABS: 150.0000 mg | ORAL_TABLET | Freq: Two times a day (BID) | ORAL | 1 refills | Status: DC

## 2016-11-28 NOTE — Telephone Encounter (Signed)
PT called in needing refills on thyroid and heartburn meds.

## 2017-01-23 ENCOUNTER — Other Ambulatory Visit: Payer: Self-pay

## 2017-01-23 DIAGNOSIS — E039 Hypothyroidism, unspecified: Secondary | ICD-10-CM

## 2017-01-24 LAB — T4 AND TSH
T4 TOTAL: 7.2 ug/dL (ref 4.5–12.0)
TSH: 4.82 u[IU]/mL — ABNORMAL HIGH (ref 0.450–4.500)

## 2017-02-01 ENCOUNTER — Ambulatory Visit: Payer: Self-pay | Admitting: Nurse Practitioner

## 2017-02-01 VITALS — BP 105/73 | HR 100 | Temp 98.1°F | Wt 214.0 lb

## 2017-02-01 DIAGNOSIS — E032 Hypothyroidism due to medicaments and other exogenous substances: Secondary | ICD-10-CM

## 2017-02-01 DIAGNOSIS — E782 Mixed hyperlipidemia: Secondary | ICD-10-CM

## 2017-02-01 DIAGNOSIS — M2550 Pain in unspecified joint: Secondary | ICD-10-CM

## 2017-02-01 DIAGNOSIS — I10 Essential (primary) hypertension: Secondary | ICD-10-CM

## 2017-02-01 DIAGNOSIS — M5440 Lumbago with sciatica, unspecified side: Secondary | ICD-10-CM

## 2017-02-01 MED ORDER — PREDNISONE 5 MG PO TABS: ORAL_TABLET | ORAL | 0 refills | Status: DC

## 2017-02-01 NOTE — Progress Notes (Signed)
LOWER BACK PAIN, SAW ORTHOPEDIST SEVERAL MONTHS AGO,  PAIN IS INTERMITTENT, BUT WHEN PRESENT IS VERY SEVERE  STATES PAIN MAY OCCUR 6 TIMES PER MONTH,    RECENT MRI SHOWED SOME ARTHROPATHY AND MILD PROTRUSION AT L5-S1 BUT NOT IN DIRECT RELATIONSHIP TO THE DEGREE OF PAIN PT IS EXPERIENCING.     EXAM: ALERT, VERBALLY, IN PAIN, BUT IN NO ACUTE DISTRESS NO BLE EDEMA AP RRR BBrS CLEAR NO CAROTID BRUITS NO THYROMEGALY NO PALPABLE ADENOPATHY    PLAN:  TSH IS MILDLY ELEVATED, WILL REDRAW WITH OTHER HEALTH MAINTENANCE LABS, LEVOTHYROXINE DOSE TO BE ADJUSTED IF INDICATED.    BACK PAIN:  INTERMITTENT AND AT TIMES SEVERE, WILL DO A 24 DAY BURST, WITH A MAX DOSE OF 40 MG.    HTN:  BP REMAINS VERY WELL CONTROLLED, WILL DEFER ANY MEDS AT THIS TIME  HYPERLIPIDEMIA:  WILL CHECK LIPID PROFILE AND ADJUST MEDS IF INDICATED.

## 2017-02-03 LAB — COMPREHENSIVE METABOLIC PANEL
ALK PHOS: 61 IU/L (ref 39–117)
ALT: 50 IU/L — AB (ref 0–44)
AST: 33 IU/L (ref 0–40)
Albumin/Globulin Ratio: 1.8 (ref 1.2–2.2)
Albumin: 4.3 g/dL (ref 3.5–5.5)
BILIRUBIN TOTAL: 0.4 mg/dL (ref 0.0–1.2)
BUN/Creatinine Ratio: 16 (ref 9–20)
BUN: 14 mg/dL (ref 6–24)
CHLORIDE: 102 mmol/L (ref 96–106)
CO2: 20 mmol/L (ref 18–29)
CREATININE: 0.89 mg/dL (ref 0.76–1.27)
Calcium: 9.2 mg/dL (ref 8.7–10.2)
GFR calc Af Amer: 109 mL/min/{1.73_m2} (ref >59)
GFR calc non Af Amer: 94 mL/min/{1.73_m2} (ref >59)
GLUCOSE: 131 mg/dL — AB (ref 65–99)
Globulin, Total: 2.4 g/dL (ref 1.5–4.5)
Potassium: 4 mmol/L (ref 3.5–5.2)
Sodium: 138 mmol/L (ref 134–144)
Total Protein: 6.7 g/dL (ref 6.0–8.5)

## 2017-02-03 LAB — LIPID PANEL
CHOL/HDL RATIO: 4.8 ratio (ref 0.0–5.0)
Cholesterol, Total: 183 mg/dL (ref 100–199)
HDL: 38 mg/dL — ABNORMAL LOW (ref >39)
LDL CALC: 92 mg/dL (ref 0–99)
Triglycerides: 265 mg/dL — ABNORMAL HIGH (ref 0–149)
VLDL Cholesterol Cal: 53 mg/dL — ABNORMAL HIGH (ref 5–40)

## 2017-02-03 LAB — TSH: TSH: 2.67 u[IU]/mL (ref 0.450–4.500)

## 2017-02-03 LAB — RHEUMATOID ARTHRITIS PROFILE
Cyclic Citrullin Peptide Ab: 6 units (ref 0–19)
Rhuematoid fact SerPl-aCnc: <10 IU/mL (ref 0.0–13.9)

## 2017-02-12 ENCOUNTER — Encounter: Payer: Self-pay | Admitting: Pharmacist

## 2017-02-12 ENCOUNTER — Ambulatory Visit: Payer: Self-pay | Admitting: Pharmacist

## 2017-02-12 ENCOUNTER — Telehealth: Payer: Self-pay | Admitting: Pharmacy Technician

## 2017-02-12 VITALS — BP 110/80 | Ht 66.0 in | Wt 219.0 lb

## 2017-02-12 DIAGNOSIS — Z79899 Other long term (current) drug therapy: Secondary | ICD-10-CM

## 2017-02-12 NOTE — Telephone Encounter (Signed)
[facility] approved [facility] through 2018, as long as patient's income does not exceed 250% FPL or pt obtains prescription coverage.  Sherilyn DacostaBetty J. Rondalyn Belford Care Manager [facility]

## 2017-02-12 NOTE — Progress Notes (Signed)
[facility] Visit Note  Patient: Wayne BumpRodney A Roy MRN: [id number] Date of Birth: 05/18/1956 PCP: No PCP Per Patient   Wayne Bumpodney A Trusty 61 y.o. male presents for an annual MTM visit today.  BP 110/80   Ht 5\' 6"  (1.676 m)   Wt 219 lb (99.3 kg)   BMI 35.35 kg/m   Patient Information   Past Medical History:  Diagnosis Date  . Arthritis   . Bipolar 1 disorder (HCC)   . Hypertension   . Hypothyroidism   . Myocardial infarction      No past surgical history on file.   Family History  Problem Relation Age of Onset  . Arthritis Mother   . Bipolar disorder Mother   . Hypertension Sister     New Diagnoses (since last visit): none  Family Support: Good  Lifestyle Diet: Breakfast: egg sandwiches Lunch: nothing  Dinner: meat, potatoes, green beans  Drinks: diet [employer]ountain [employer]       Exercise limited by: None identified    History  Alcohol Use No      History  Smoking Status  . Current Every Day Smoker  . Packs/day: 0.50  . Years: 45.00  Smokeless Tobacco  . Never Used      Health Maintenance  Topic Date Due  . Hepatitis C Screening  [birth year]  . HIV Screening  08/15/1973  . TETANUS/TDAP  08/15/1977  . COLONOSCOPY  08/15/2008  . INFLUENZA VACCINE  07/11/2016   Outpatient Encounter Prescriptions as of 02/12/2017  Medication Sig  . acetaminophen (TYLENOL) 500 MG tablet Take 1,000 mg by mouth every 6 (six) hours as needed for mild pain or headache.  . ARIPiprazole (ABILIFY) 10 MG tablet Take 10 mg by mouth daily.  . busPIRone (BUSPAR) 15 MG tablet Take 30 mg by mouth 2 (two) times daily.  . cyclobenzaprine (FLEXERIL) 10 MG tablet Take 1 tablet (10 mg total) by mouth 2 (two) times daily as needed for muscle spasms.  . fenofibrate (TRICOR) 145 MG tablet Take 1 tablet (145 mg total) by mouth daily.  Marland Kitchen. levothyroxine (SYNTHROID, LEVOTHROID) 25 MCG tablet 1.5 tables by mouth each day  . pantoprazole (PROTONIX) 20 MG tablet Take 1 tablet (20 mg total)  by mouth daily.  . predniSONE (DELTASONE) 5 MG tablet BY MOUTH, BEGIN WITH 8 TABS, EACH 3RD DAY DECREASE BY ONE TAB. 8-8-8-7-7-7-6-6-6-5-5-5-4-4-4-3-3-3-2-2-2-1-1-1  . ranitidine (ZANTAC) 150 MG tablet Take 1 tablet (150 mg total) by mouth 2 (two) times daily.  Marland Kitchen. venlafaxine XR (EFFEXOR-XR) 150 MG 24 hr capsule Take 300 mg by mouth daily with breakfast.  . aspirin EC 81 MG tablet Take 81 mg by mouth daily.  . mirtazapine (REMERON) 30 MG tablet Take 30 mg by mouth at bedtime. Reported on 02/24/2016  . naproxen (NAPROSYN) 500 MG tablet Take 1 tablet (500 mg total) by mouth 2 (two) times daily with a meal. (Patient not taking: Reported on 02/12/2017)  . polyethylene glycol (MIRALAX / GLYCOLAX) packet Take 17 g by mouth daily as needed for mild constipation.   No facility-administered encounter medications on file as of 02/12/2017.      Assessment and Plan: 61yo male presents to the medication management clinic for an annual MTM visit. Patient has been compliant with his medications due to the help of a pill box.   Bipolar disorder: Patient has been having some depression lately, mainly due to the winter months and not having a job. He first realized he had bipolar disorder in 2007 due to  a manic episode. He goes to a Veterinary surgeon at [employer] [facility] about once a month. He denies signs of SI at this time. He feels as if he can talk to his mom about things and is supportive. He takes aripiprazole, buspirone, mirtazapine, and venlafaxine.  HLD: Patient is having a cholesterol panel in 6 months. He is currently on fenofibrate. Questioned if patient was ever on a statin, he says that he was previously on a cholesterol medication for years, but is unsure of the name of it.  Hypothyroidism: Denies symptoms of nail changes, hair loss, and cold intolerance. He takes levothyroxine.  Back pain: Uncontrolled at this time. He has had back pain for about 15 years. He takes flexeril for this. He has tried heat before and does not  feel like this helps. Counseled patient on trying ice and heat, in addition, to stretching muscles.   Delsa Bern, PharmD 3:50 PM 02/12/2017

## 2017-02-15 ENCOUNTER — Ambulatory Visit: Payer: Self-pay | Admitting: Adult Health Nurse Practitioner

## 2017-02-15 VITALS — BP 122/82 | HR 112 | Temp 98.3°F | Wt 217.9 lb

## 2017-02-15 DIAGNOSIS — M549 Dorsalgia, unspecified: Secondary | ICD-10-CM | POA: Insufficient documentation

## 2017-02-15 DIAGNOSIS — E039 Hypothyroidism, unspecified: Secondary | ICD-10-CM

## 2017-02-15 DIAGNOSIS — M545 Low back pain: Secondary | ICD-10-CM

## 2017-02-15 DIAGNOSIS — G8929 Other chronic pain: Secondary | ICD-10-CM

## 2017-02-15 NOTE — Progress Notes (Signed)
Patient: Wayne Roy Male    DOB: 10-Dec-1956   61 y.o.   MRN: [id number] Visit Date: 02/15/2017  Today's Provider: Jacelyn Pi, NP   Chief Complaint  Patient presents with  . Follow-up   Subjective:    HPI   Reviewed labwork.  TSH  WNL.   Pt states that his back pain is not improved.  Pt states that he has been to the chiropractor one time with no intervention.  Taking medications as directed.   Allergies  Allergen Reactions  . Saphris [Asenapine] Other (See Comments)    Other reaction(s): Other (See Comments) Reaction:Sores in pts mouth, nose, and ears Reaction:  Sores in pts mouth, nose, and ears   Previous Medications   ACETAMINOPHEN (TYLENOL) 500 MG TABLET    Take 1,000 mg by mouth every 6 (six) hours as needed for mild pain or headache.   ARIPIPRAZOLE (ABILIFY) 10 MG TABLET    Take 10 mg by mouth daily.   ASPIRIN EC 81 MG TABLET    Take 81 mg by mouth daily.   BUSPIRONE (BUSPAR) 15 MG TABLET    Take 30 mg by mouth 2 (two) times daily.   CYCLOBENZAPRINE (FLEXERIL) 10 MG TABLET    Take 1 tablet (10 mg total) by mouth 2 (two) times daily as needed for muscle spasms.   FENOFIBRATE (TRICOR) 145 MG TABLET    Take 1 tablet (145 mg total) by mouth daily.   LEVOTHYROXINE (SYNTHROID, LEVOTHROID) 25 MCG TABLET    1.5 tables by mouth each day   MIRTAZAPINE (REMERON) 30 MG TABLET    Take 30 mg by mouth at bedtime. Reported on 02/24/2016   NAPROXEN (NAPROSYN) 500 MG TABLET    Take 1 tablet (500 mg total) by mouth 2 (two) times daily with a meal.   PANTOPRAZOLE (PROTONIX) 20 MG TABLET    Take 1 tablet (20 mg total) by mouth daily.   POLYETHYLENE GLYCOL (MIRALAX / GLYCOLAX) PACKET    Take 17 g by mouth daily as needed for mild constipation.   PREDNISONE (DELTASONE) 5 MG TABLET    BY MOUTH, BEGIN WITH 8 TABS, EACH 3RD DAY DECREASE BY ONE TAB. 8-8-8-7-7-7-6-6-6-5-5-5-4-4-4-3-3-3-2-2-2-1-1-1   RANITIDINE (ZANTAC) 150 MG TABLET    Take 1 tablet (150 mg total) by mouth 2 (two) times  daily.   VENLAFAXINE XR (EFFEXOR-XR) 150 MG 24 HR CAPSULE    Take 300 mg by mouth daily with breakfast.    Review of Systems  All other systems reviewed and are negative.   Social History  Substance Use Topics  . Smoking status: Current Every Day Smoker    Packs/day: 0.50    Years: 45.00  . Smokeless tobacco: Never Used     Comment: 1 pack every 3 days  . Alcohol use No   Objective:   BP 122/82   Pulse (!) 112   Temp 98.3 F (36.8 C)   Wt 217 lb 14.4 oz (98.8 kg)   BMI 35.17 kg/m   Physical Exam  Constitutional: He is oriented to person, place, and time. He appears well-developed and well-nourished.  HENT:  Head: Normocephalic and atraumatic.  Cardiovascular: Regular rhythm and normal heart sounds.   Pulmonary/Chest: Effort normal and breath sounds normal.  Abdominal: Soft. Bowel sounds are normal.  Musculoskeletal:  Limited ROM of lumbar spine. Tenderness to palpation of the thoracic and lumbar spine.   Neurological: He is alert and oriented to person, place, and time.  Vitals reviewed.  Assessment & Plan:         [facility]  Continue steroid dose pack as directed.  Continue current levothyroxine dose.  HR repeat  106.   Jacelyn Pieah Doles-Johnson, NP   [facility]

## 2017-02-21 ENCOUNTER — Ambulatory Visit: Payer: Self-pay | Admitting: Chiropractor

## 2017-03-08 ENCOUNTER — Ambulatory Visit: Payer: Self-pay | Admitting: Urology

## 2017-03-08 VITALS — BP 124/87 | HR 93 | Temp 99.3°F | Wt 219.9 lb

## 2017-03-08 DIAGNOSIS — H6122 Impacted cerumen, left ear: Secondary | ICD-10-CM

## 2017-03-08 DIAGNOSIS — J0101 Acute recurrent maxillary sinusitis: Secondary | ICD-10-CM

## 2017-03-08 DIAGNOSIS — E782 Mixed hyperlipidemia: Secondary | ICD-10-CM

## 2017-03-08 MED ORDER — FENOFIBRATE 145 MG PO TABS: 145.0000 mg | ORAL_TABLET | Freq: Every day | ORAL | 3 refills | Status: DC

## 2017-03-08 MED ORDER — AZITHROMYCIN 250 MG PO TABS: ORAL_TABLET | ORAL | 0 refills | Status: DC

## 2017-03-08 NOTE — Progress Notes (Signed)
Patient: Wayne BumpRodney A Roy Male    DOB: 02/09/1956   61 y.o.   MRN: [id number] Visit Date: 03/08/2017  Today's Provider: [facility]   Chief Complaint  Patient presents with  . Ear Pain  . Sinus Problem   Subjective:    Sinus Problem  Associated symptoms include congestion.   60 WM who presents today for left ear being stopped up since last Wednesday.    He has been having sinus drainage and pressure.  No drainage from the ear.  No ear pain.  He tried OTC ear wax cleaner and it just ran out of his ear.  His hearing has diminished 50 % in that ear.    He has no history of ear wax build up.  He has not stuck any foreign objects in his ear or had trauma to the ear.    Sinus drainage for the last week or two.  Slight fever last week.  No coughing.  Rhinorrhea.  Drainage is yellow.      Allergies  Allergen Reactions  . Saphris [Asenapine] Other (See Comments)    Other reaction(s): Other (See Comments) Reaction:Sores in pts mouth, nose, and ears Reaction:  Sores in pts mouth, nose, and ears   Previous Medications   ACETAMINOPHEN (TYLENOL) 500 MG TABLET    Take 1,000 mg by mouth every 6 (six) hours as needed for mild pain or headache.   ARIPIPRAZOLE (ABILIFY) 10 MG TABLET    Take 10 mg by mouth daily.   ASPIRIN EC 81 MG TABLET    Take 81 mg by mouth daily.   BUSPIRONE (BUSPAR) 15 MG TABLET    Take 30 mg by mouth 2 (two) times daily.   CYCLOBENZAPRINE (FLEXERIL) 10 MG TABLET    Take 1 tablet (10 mg total) by mouth 2 (two) times daily as needed for muscle spasms.   LEVOTHYROXINE (SYNTHROID, LEVOTHROID) 25 MCG TABLET    1.5 tables by mouth each day   MIRTAZAPINE (REMERON) 30 MG TABLET    Take 30 mg by mouth at bedtime. Reported on 02/24/2016   NAPROXEN (NAPROSYN) 500 MG TABLET    Take 1 tablet (500 mg total) by mouth 2 (two) times daily with a meal.   PANTOPRAZOLE (PROTONIX) 20 MG TABLET    Take 1 tablet (20 mg total) by mouth daily.   POLYETHYLENE GLYCOL (MIRALAX / GLYCOLAX)  PACKET    Take 17 g by mouth daily as needed for mild constipation.   PREDNISONE (DELTASONE) 5 MG TABLET    BY MOUTH, BEGIN WITH 8 TABS, EACH 3RD DAY DECREASE BY ONE TAB. 8-8-8-7-7-7-6-6-6-5-5-5-4-4-4-3-3-3-2-2-2-1-1-1   RANITIDINE (ZANTAC) 150 MG TABLET    Take 1 tablet (150 mg total) by mouth 2 (two) times daily.   VENLAFAXINE XR (EFFEXOR-XR) 150 MG 24 HR CAPSULE    Take 300 mg by mouth daily with breakfast.    Review of Systems  HENT: Positive for congestion, postnasal drip and rhinorrhea.   All other systems reviewed and are negative.   Social History  Substance Use Topics  . Smoking status: Current Every Day Smoker    Packs/day: 0.50    Years: 45.00  . Smokeless tobacco: Never Used     Comment: 1 pack every 3 days  . Alcohol use No   4.    BP 124/87   Pulse 93   Temp 99.3 F (37.4 C)   Wt 219 lb 14.4 oz (99.7 kg)   BMI 35.49 kg/m   Physical Exam  Constitutional: He is oriented to person, place, and time. He appears well-developed and well-nourished.  HENT:  Head: Normocephalic and atraumatic.  Right Ear: Hearing normal.  Left Ear: Decreased hearing is noted.  Left ear canal covered with cerumen   Cardiovascular: Regular rhythm and normal heart sounds.   Pulmonary/Chest: Effort normal and breath sounds normal.  Abdominal: Soft. Bowel sounds are normal.  Musculoskeletal:  Limited ROM of lumbar spine. Tenderness to palpation of the thoracic and lumbar spine.   Neurological: He is alert and oriented to person, place, and time.  Vitals reviewed.       Assessment & Plan:   1. Cerumen impaction of left ear  - attempted to remove wax but the wax was hard  - refer to ENT  2. Sinusitis  - prescribed Z-pak  3. Back pain  - needs to see chiropractor again  4. HLD  - continue Tricor     [facility]   [facility]

## 2017-03-09 ENCOUNTER — Telehealth: Payer: Self-pay

## 2017-03-09 NOTE — Telephone Encounter (Signed)
Referral sent to [facility] today.

## 2017-05-30 ENCOUNTER — Other Ambulatory Visit: Payer: Self-pay | Admitting: Internal Medicine

## 2017-05-30 DIAGNOSIS — K219 Gastro-esophageal reflux disease without esophagitis: Secondary | ICD-10-CM

## 2017-06-29 ENCOUNTER — Other Ambulatory Visit: Payer: Self-pay | Admitting: Internal Medicine

## 2017-08-09 ENCOUNTER — Other Ambulatory Visit: Payer: Self-pay

## 2017-08-09 DIAGNOSIS — I10 Essential (primary) hypertension: Secondary | ICD-10-CM

## 2017-08-09 DIAGNOSIS — E039 Hypothyroidism, unspecified: Secondary | ICD-10-CM

## 2017-08-16 ENCOUNTER — Ambulatory Visit: Payer: Self-pay

## 2017-08-17 LAB — COMPREHENSIVE METABOLIC PANEL
A/G RATIO: 2 (ref 1.2–2.2)
ALT: 25 IU/L (ref 0–44)
AST: 28 IU/L (ref 0–40)
Albumin: 4.2 g/dL (ref 3.5–5.5)
Alkaline Phosphatase: 58 IU/L (ref 39–117)
BUN/Creatinine Ratio: 18 (ref 9–20)
BUN: 21 mg/dL (ref 6–24)
Bilirubin Total: 0.3 mg/dL (ref 0.0–1.2)
CALCIUM: 9.1 mg/dL (ref 8.7–10.2)
CO2: 21 mmol/L (ref 20–29)
Chloride: 107 mmol/L — ABNORMAL HIGH (ref 96–106)
Creatinine, Ser: 1.15 mg/dL (ref 0.76–1.27)
GFR calc Af Amer: 81 mL/min/{1.73_m2} (ref >59)
GFR, EST NON AFRICAN AMERICAN: 70 mL/min/{1.73_m2} (ref >59)
GLOBULIN, TOTAL: 2.1 g/dL (ref 1.5–4.5)
Glucose: 110 mg/dL — ABNORMAL HIGH (ref 65–99)
POTASSIUM: 3.9 mmol/L (ref 3.5–5.2)
SODIUM: 144 mmol/L (ref 134–144)
Total Protein: 6.3 g/dL (ref 6.0–8.5)

## 2017-08-17 LAB — LIPID PANEL
CHOL/HDL RATIO: 3.9 ratio (ref 0.0–5.0)
Cholesterol, Total: 151 mg/dL (ref 100–199)
HDL: 39 mg/dL — ABNORMAL LOW (ref >39)
LDL CALC: 68 mg/dL (ref 0–99)
Triglycerides: 219 mg/dL — ABNORMAL HIGH (ref 0–149)
VLDL CHOLESTEROL CAL: 44 mg/dL — AB (ref 5–40)

## 2017-08-17 LAB — 14.3.3 ETA, RHEUM. ARTHRITIS

## 2017-08-17 LAB — TSH: TSH: 1.38 u[IU]/mL (ref 0.450–4.500)

## 2017-08-21 ENCOUNTER — Other Ambulatory Visit: Payer: Self-pay | Admitting: Internal Medicine

## 2017-08-21 DIAGNOSIS — K219 Gastro-esophageal reflux disease without esophagitis: Secondary | ICD-10-CM

## 2017-08-30 ENCOUNTER — Other Ambulatory Visit: Payer: Self-pay

## 2017-09-20 ENCOUNTER — Ambulatory Visit: Payer: Self-pay

## 2017-10-16 ENCOUNTER — Ambulatory Visit: Payer: Self-pay

## 2017-10-18 ENCOUNTER — Telehealth: Payer: Self-pay

## 2017-10-18 NOTE — Telephone Encounter (Signed)
[facility]erral [facility] to [facility].

## 2017-11-05 ENCOUNTER — Other Ambulatory Visit: Payer: Self-pay | Admitting: Internal Medicine

## 2017-11-05 DIAGNOSIS — E039 Hypothyroidism, unspecified: Secondary | ICD-10-CM

## 2017-11-06 ENCOUNTER — Other Ambulatory Visit: Payer: Self-pay

## 2017-11-06 DIAGNOSIS — M5440 Lumbago with sciatica, unspecified side: Principal | ICD-10-CM

## 2017-11-06 DIAGNOSIS — G8929 Other chronic pain: Secondary | ICD-10-CM

## 2017-11-06 MED ORDER — CYCLOBENZAPRINE HCL 10 MG PO TABS: 10.0000 mg | ORAL_TABLET | Freq: Two times a day (BID) | ORAL | 0 refills | Status: DC | PRN

## 2017-11-14 ENCOUNTER — Ambulatory Visit: Payer: Self-pay | Admitting: Ophthalmology

## 2017-11-15 ENCOUNTER — Other Ambulatory Visit: Payer: Self-pay | Admitting: Internal Medicine

## 2017-11-15 DIAGNOSIS — G8929 Other chronic pain: Secondary | ICD-10-CM

## 2017-11-15 DIAGNOSIS — M5440 Lumbago with sciatica, unspecified side: Principal | ICD-10-CM

## 2018-03-04 ENCOUNTER — Other Ambulatory Visit: Payer: Self-pay | Admitting: Internal Medicine

## 2018-03-04 ENCOUNTER — Encounter (INDEPENDENT_AMBULATORY_CARE_PROVIDER_SITE_OTHER): Payer: Self-pay

## 2018-03-04 ENCOUNTER — Ambulatory Visit: Payer: Self-pay | Admitting: Pharmacy Technician

## 2018-03-04 DIAGNOSIS — Z79899 Other long term (current) drug therapy: Secondary | ICD-10-CM

## 2018-03-04 DIAGNOSIS — E039 Hypothyroidism, unspecified: Secondary | ICD-10-CM

## 2018-03-04 NOTE — Progress Notes (Signed)
  Completed [facility] application for recertification.   Patient to provide pay stubs for 2019 and 2018 taxes.    Wayne DacostaBetty J. Moxie Roy Care Manager [facility]

## 2018-03-07 ENCOUNTER — Ambulatory Visit: Payer: Self-pay | Admitting: Ophthalmology

## 2018-03-12 ENCOUNTER — Ambulatory Visit: Payer: Self-pay | Admitting: Licensed Clinical Social Worker

## 2018-03-12 DIAGNOSIS — F310 Bipolar disorder, current episode hypomanic: Secondary | ICD-10-CM

## 2018-03-13 NOTE — Progress Notes (Signed)
Subjective:  Patient ID: Wayne Roy, male   DOB: 04/19/1956, 62 y.o.   MRN: [id number]    Increase emotional regulation  Wayne Roy  presents with agitation and irritability. Onset of symptoms was several years with unchanged unchanged. Symptoms have been occurringNot influenced by the time of the day.  Symptoms are currently rated marked. Associated signs and symptoms include: anger, attention difficulties, highly negative and difficulty sleepingFinancial difficulties Health problems Marital or family conflict.   He reports that he wanted to see [facility] of [facility] because this once a month or every other month for appointments is not working for him. He notes that he stopped taking his mental health medications because he didn't feel like himself, it made him numb, and not care about anything. He explains that he is starting to feel like himself again but is unable to sleep and feels on edge most of the time. He notes that he is dealing with stress with his mom, his step dad, and his sister. He notes that he went out and bought a car on Saturday on a whim after his started messing up. He notes that he plans to go to the beach this weekend and invited his mom along. He notes that he has been more social and hung out with some friends outside of work. He admits to having racing thoughts, irritability, and episodes of anger. He reports that he is having problems with his feet; fungus, and pain. He denies feeling depressed. He denies symptoms of mania and hypo mania. He denies suicidal and homicidal thoughts.   Therapeutic Interventions: Cognitive Behavioral therapy was utilized by the clinician during today's session focusing on patient's symptoms of anger and effect on normal cognition. Clinician processed with the patient regarding why he didn't speak up at this last session about how he was truly feeling and that the medications were making him numb. Clinician explained that if he is not  completely honest with her about what is happening then she cannot truly help him. Clinician processed with the patient regarding the reason that he stopped taking his medications. Clinician explained that it's not advised to stop taking your medications because he can cause severe side effects and it's best to be weaned off of by his psychiatrist. Clinician explained that its his right and choice to stop taking the mental health medications. Clinician explained that in terms of plan of safety; call the clinic, 24 hour crisis line, her, or go to the nearest emergency room if suicidal thoughts arise with a plan. Clinician explained that it sounds like his lack of sleep is affecting his mood and causing him to project his anger onto others when upset. Clinician suggested that the patient try to stop and think when angry instead of saying something that he might regret. Clinician explained that she recommends that they have weekly sessions at [facility] until his mood starts to improve and focus on his symptoms of anger.   Return visit in 1 week.    Effectiveness:  Established problem, stable/improving (1). Progressing It is felt more time is needed for Interventions to work. . Patient is fully or not fully Other:  orientated to time and place. Patient's Appropriate into problems. Active. Thought process is  Coherent.Minimal: No identifiable suicidal ideation.  Patients presenting with no risk factors but with morbid ruminations; may be classified as minimal risk based on the severity of the depressive symptoms and None.   Homework: Schedule an appointment at [facility] to see a provider for difficulty sleeping, foot fungus, and pain in feet.  Stop and think when angry instead of blurting out what comes to mind.   Plan: Follow up with Wayne Bullocks, LCSW at [facility] in one week or earlier if needed.

## 2018-03-15 ENCOUNTER — Telehealth: Payer: Self-pay | Admitting: Pharmacy Technician

## 2018-03-15 NOTE — Telephone Encounter (Signed)
Patient failed to provide 2019 financial documentation.  No additional medication assistance will be provided by [facility] without the required proof of income documentation.  Patient notified by letter.  Yolando Gillum J. Keilan Nichol Care Manager [facility] 

## 2018-03-19 ENCOUNTER — Ambulatory Visit: Payer: Self-pay | Admitting: Family Medicine

## 2018-03-19 ENCOUNTER — Ambulatory Visit: Payer: Self-pay | Admitting: Licensed Clinical Social Worker

## 2018-03-19 VITALS — BP 151/92 | HR 81 | Temp 97.5°F | Wt 207.0 lb

## 2018-03-19 DIAGNOSIS — B351 Tinea unguium: Secondary | ICD-10-CM

## 2018-03-19 DIAGNOSIS — R5383 Other fatigue: Secondary | ICD-10-CM

## 2018-03-19 DIAGNOSIS — E039 Hypothyroidism, unspecified: Secondary | ICD-10-CM

## 2018-03-19 DIAGNOSIS — F310 Bipolar disorder, current episode hypomanic: Secondary | ICD-10-CM

## 2018-03-19 DIAGNOSIS — Z5181 Encounter for therapeutic drug level monitoring: Secondary | ICD-10-CM

## 2018-03-19 DIAGNOSIS — E785 Hyperlipidemia, unspecified: Secondary | ICD-10-CM | POA: Insufficient documentation

## 2018-03-19 DIAGNOSIS — F411 Generalized anxiety disorder: Secondary | ICD-10-CM

## 2018-03-19 DIAGNOSIS — Z720 Tobacco use: Secondary | ICD-10-CM

## 2018-03-19 DIAGNOSIS — E782 Mixed hyperlipidemia: Secondary | ICD-10-CM

## 2018-03-19 DIAGNOSIS — I10 Essential (primary) hypertension: Secondary | ICD-10-CM

## 2018-03-19 MED ORDER — AMLODIPINE BESYLATE 5 MG PO TABS: 5.0000 mg | ORAL_TABLET | Freq: Every day | ORAL | 5 refills | Status: DC

## 2018-03-19 NOTE — Patient Instructions (Addendum)
Try to follow the DASH guidelines (DASH stands for Dietary Approaches to Stop Hypertension). Try to limit the sodium in your diet to no more than 1,500mg  of sodium per day. Certainly try to not exceed 2,000 mg per day at the very most. Do not add salt when cooking or at the table.  Check the sodium amount on labels when shopping, and choose items lower in sodium when given a choice. Avoid or limit foods that already contain a lot of sodium. Eat a diet rich in fruits and vegetables and whole grains, and try to lose weight if overweight or obese  DASH Eating Plan DASH stands for "Dietary Approaches to Stop Hypertension." The DASH eating plan is a healthy eating plan that has been shown to reduce high blood pressure (hypertension). It may also reduce your risk for type 2 diabetes, heart disease, and stroke. The DASH eating plan may also help with weight loss. What are tips for following this plan? General guidelines  Avoid eating more than 2,300 mg (milligrams) of salt (sodium) a day. If you have hypertension, you may need to reduce your sodium intake to 1,500 mg a day.  Limit alcohol intake to no more than 1 drink a day for nonpregnant women and 2 drinks a day for men. One drink equals 12 oz of beer, 5 oz of wine, or 1 oz of hard liquor.  Work with your health care provider to maintain a healthy body weight or to lose weight. Ask what an ideal weight is for you.  Get at least 30 minutes of exercise that causes your heart to beat faster (aerobic exercise) most days of the week. Activities may include walking, swimming, or biking.  Work with your health care provider or diet and nutrition specialist (dietitian) to adjust your eating plan to your individual calorie needs. Reading food labels  Check food labels for the amount of sodium per serving. Choose foods with less than 5 percent of the Daily Value of sodium. Generally, foods with less than 300 mg of sodium per serving fit into this eating  plan.  To find whole grains, look for the word "whole" as the first word in the ingredient list. Shopping  Buy products labeled as "low-sodium" or "no salt added."  Buy fresh foods. Avoid canned foods and premade or frozen meals. Cooking  Avoid adding salt when cooking. Use salt-free seasonings or herbs instead of table salt or sea salt. Check with your health care provider or pharmacist before using salt substitutes.  Do not fry foods. Cook foods using healthy methods such as baking, boiling, grilling, and broiling instead.  Cook with heart-healthy oils, such as olive, canola, soybean, or sunflower oil. Meal planning   Eat a balanced diet that includes: ? 5 or more servings of fruits and vegetables each day. At each meal, try to fill half of your plate with fruits and vegetables. ? Up to 6-8 servings of whole grains each day. ? Less than 6 oz of lean meat, poultry, or fish each day. A 3-oz serving of meat is about the same size as a deck of cards. One egg equals 1 oz. ? 2 servings of low-fat dairy each day. ? A serving of nuts, seeds, or beans 5 times each week. ? Heart-healthy fats. Healthy fats called Omega-3 fatty acids are found in foods such as flaxseeds and coldwater fish, like sardines, salmon, and mackerel.  Limit how much you eat of the following: ? Canned or prepackaged foods. ? Food that  is high in trans fat, such as fried foods. ? Food that is high in saturated fat, such as fatty meat. ? Sweets, desserts, sugary drinks, and other foods with added sugar. ? Full-fat dairy products.  Do not salt foods before eating.  Try to eat at least 2 vegetarian meals each week.  Eat more home-cooked food and less restaurant, buffet, and fast food.  When eating at a restaurant, ask that your food be prepared with less salt or no salt, if possible. What foods are recommended? The items listed may not be a complete list. Talk with your dietitian about what dietary choices are best  for you. Grains Whole-grain or whole-wheat bread. Whole-grain or whole-wheat pasta. Brown rice. Oatmeal. Quinoa. Bulgur. Whole-grain and low-sodium cereals. Pita bread. Low-fat, low-sodium crackers. Whole-wheat flour tortillas. Vegetables Fresh or frozen vegetables (raw, steamed, roasted, or grilled). Low-sodium or reduced-sodium tomato and vegetable juice. Low-sodium or reduced-sodium tomato sauce and tomato paste. Low-sodium or reduced-sodium canned vegetables. Fruits All fresh, dried, or frozen fruit. Canned fruit in natural juice (without added sugar). Meat and other protein foods Skinless chicken or turkey. Ground chicken or turkey. Pork with fat trimmed off. Fish and seafood. Egg whites. Dried beans, peas, or lentils. Unsalted nuts, nut butters, and seeds. Unsalted canned beans. Lean cuts of beef with fat trimmed off. Low-sodium, lean deli meat. Dairy Low-fat (1%) or fat-free (skim) milk. Fat-free, low-fat, or reduced-fat cheeses. Nonfat, low-sodium ricotta or cottage cheese. Low-fat or nonfat yogurt. Low-fat, low-sodium cheese. Fats and oils Soft margarine without trans fats. Vegetable oil. Low-fat, reduced-fat, or light mayonnaise and salad dressings (reduced-sodium). Canola, safflower, olive, soybean, and sunflower oils. Avocado. Seasoning and other foods Herbs. Spices. Seasoning mixes without salt. Unsalted popcorn and pretzels. Fat-free sweets. What foods are not recommended? The items listed may not be a complete list. Talk with your dietitian about what dietary choices are best for you. Grains Baked goods made with fat, such as croissants, muffins, or some breads. Dry pasta or rice meal packs. Vegetables Creamed or fried vegetables. Vegetables in a cheese sauce. Regular canned vegetables (not low-sodium or reduced-sodium). Regular canned tomato sauce and paste (not low-sodium or reduced-sodium). Regular tomato and vegetable juice (not low-sodium or reduced-sodium). Pickles.  Olives. Fruits Canned fruit in a light or heavy syrup. Fried fruit. Fruit in cream or butter sauce. Meat and other protein foods Fatty cuts of meat. Ribs. Fried meat. Bacon. Sausage. Bologna and other processed lunch meats. Salami. Fatback. Hotdogs. Bratwurst. Salted nuts and seeds. Canned beans with added salt. Canned or smoked fish. Whole eggs or egg yolks. Chicken or turkey with skin. Dairy Whole or 2% milk, cream, and half-and-half. Whole or full-fat cream cheese. Whole-fat or sweetened yogurt. Full-fat cheese. Nondairy creamers. Whipped toppings. Processed cheese and cheese spreads. Fats and oils Butter. Stick margarine. Lard. Shortening. Ghee. Bacon fat. Tropical oils, such as coconut, palm kernel, or palm oil. Seasoning and other foods Salted popcorn and pretzels. Onion salt, garlic salt, seasoned salt, table salt, and sea salt. Worcestershire sauce. Tartar sauce. Barbecue sauce. Teriyaki sauce. Soy sauce, including reduced-sodium. Steak sauce. Canned and packaged gravies. Fish sauce. Oyster sauce. Cocktail sauce. Horseradish that you find on the shelf. Ketchup. Mustard. Meat flavorings and tenderizers. Bouillon cubes. Hot sauce and Tabasco sauce. Premade or packaged marinades. Premade or packaged taco seasonings. Relishes. Regular salad dressings. Where to find more information:  National Heart, Lung, and Blood Institute: www.nhlbi.nih.gov  American Heart Association: www.heart.org Summary  The DASH eating plan is a healthy   eating plan that has been shown to reduce high blood pressure (hypertension). It may also reduce your risk for type 2 diabetes, heart disease, and stroke.  With the DASH eating plan, you should limit salt (sodium) intake to 2,300 mg a day. If you have hypertension, you may need to reduce your sodium intake to 1,500 mg a day.  When on the DASH eating plan, aim to eat more fresh fruits and vegetables, whole grains, lean proteins, low-fat dairy, and heart-healthy  fats.  Work with your health care provider or diet and nutrition specialist (dietitian) to adjust your eating plan to your individual calorie needs. This information is not intended to replace advice given to you by your health care provider. Make sure you discuss any questions you have with your health care provider. Document Released: 11/16/2011 Document Revised: 11/20/2016 Document Reviewed: 11/20/2016 Elsevier Interactive Patient Education  2018 [employer]  Steps to Quit Smoking Smoking tobacco can be bad for your health. It can also affect almost every organ in your body. Smoking puts you and people around you at risk for many serious long-lasting (chronic) diseases. Quitting smoking is hard, but it is one of the best things that you can do for your health. It is never too late to quit. What are the benefits of quitting smoking? When you quit smoking, you lower your risk for getting serious diseases and conditions. They can include:  Lung cancer or lung disease.  Heart disease.  Stroke.  Heart attack.  Not being able to have children (infertility).  Weak bones (osteoporosis) and broken bones (fractures).  If you have coughing, wheezing, and shortness of breath, those symptoms may get better when you quit. You may also get sick less often. If you are pregnant, quitting smoking can help to lower your chances of having a baby of low birth weight. What can I do to help me quit smoking? Talk with your doctor about what can help you quit smoking. Some things you can do (strategies) include:  Quitting smoking totally, instead of slowly cutting back how much you smoke over a period of time.  Going to in-person counseling. You are more likely to quit if you go to many counseling sessions.  Using resources and support systems, such as: ? Agricultural engineernline chats with a Veterinary surgeoncounselor. ? Phone quitlines. ? Automotive engineerrinted self-help materials. ? Support groups or group counseling. ? Text messaging  programs. ? Mobile phone apps or applications.  Taking medicines. Some of these medicines may have nicotine in them. If you are pregnant or breastfeeding, do not take any medicines to quit smoking unless your doctor says it is okay. Talk with your doctor about counseling or other things that can help you.  Talk with your doctor about using more than one strategy at the same time, such as taking medicines while you are also going to in-person counseling. This can help make quitting easier. What things can I do to make it easier to quit? Quitting smoking might feel very hard at first, but there is a lot that you can do to make it easier. Take these steps:  Talk to your family and friends. Ask them to support and encourage you.  Call phone quitlines, reach out to support groups, or work with a Veterinary surgeoncounselor.  Ask people who smoke to not smoke around you.  Avoid places that make you want (trigger) to smoke, such as: ? Bars. ? Parties. ? Smoke-break areas at work.  Spend time with people who do not smoke.  Lower the stress in your life. Stress can make you want to smoke. Try these things to help your stress: ? Getting regular exercise. ? Deep-breathing exercises. ? Yoga. ? Meditating. ? Doing a body scan. To do this, close your eyes, focus on one area of your body at a time from head to toe, and notice which parts of your body are tense. Try to relax the muscles in those areas.  Download or buy apps on your mobile phone or tablet that can help you stick to your quit plan. There are many free apps, such as QuitGuide from the [employer] developer for Disease Control and Prevention). You can find more support from smokefree.gov and other websites.  This information is not intended to replace advice given to you by your health care provider. Make sure you discuss any questions you have with your health care provider. Document Released: 09/23/2009 Document Revised: 07/25/2016 Document Reviewed:  04/13/2015 Elsevier Interactive Patient Education  2018 [employer].

## 2018-03-19 NOTE — Progress Notes (Signed)
BP (!) 151/92   Pulse 81   Temp (!) 97.5 F (36.4 C)   Wt 207 lb (93.9 kg)   BMI 33.41 kg/m    Subjective:    Patient ID: Wayne Roy, male    DOB: 1956-04-15, 62 y.o.   MRN: [id number]  HPI: Wayne Roy is a 62 y.o. male  Chief Complaint  Patient presents with  . Follow-up    lack of sleep and energy. feet and knees itching, palms of hands itching. concerned about thyroid.    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  Relevant past medical, surgical, family and social history reviewed Past Medical History:  Diagnosis Date  . Arthritis   . Bipolar 1 disorder ([place])   . Hypertension   . Hypothyroidism   . Myocardial infarction ([place])    No past surgical history on file. Family History  Problem Relation Age of Onset  . Arthritis Mother   . Bipolar disorder Mother   . Hypertension Sister    Social History   Tobacco Use  . Smoking status: Current Every Day Smoker    Packs/day: 0.50    Years: 45.00    Pack years: 22.50  . Smokeless tobacco: Never Used  . Tobacco comment: 1 pack every 3 days  Substance Use Topics  . Alcohol use: No  . Drug  use: No  smoking 1.5 ppd per patient, reported to MD  Interim medical history since last visit reviewed. Allergies and medications reviewed  Review of Systems Per HPI unless specifically indicated above     Objective:    BP (!) 151/92   Pulse 81   Temp (!) 97.5 F (36.4 C)   Wt 207 lb (93.9 kg)   BMI 33.41 kg/m   Wt Readings from Last 3 Encounters:  03/19/18 207 lb (93.9 kg)  03/08/17 219 lb 14.4 oz (99.7 kg)  02/15/17 217 lb 14.4 oz (98.8 kg)    Physical Exam  Constitutional: He appears well-developed and well-nourished. No distress.  obese  HENT:  Head: Normocephalic and atraumatic.  Eyes: EOM are normal. No scleral icterus.  Neck: No thyromegaly present.  Cardiovascular: Normal rate and regular rhythm.  Pulmonary/Chest: Effort normal and breath sounds normal.  Abdominal: He exhibits no distension.  Musculoskeletal: He exhibits no edema.  Neurological: Coordination normal.  Skin: Skin is warm and dry. No pallor.  Left foot examined: thick toenails; callus/verrucous lesion on the plantar surface of the fifth toe left foot  Psychiatric: He has a normal mood and affect.  Good eye contact, somewhat cavalier with attitude about smoking, eating whatever he  wishes    Results for orders placed or performed in visit on 08/09/17  Comprehensive metabolic panel  Result Value Ref Range   Glucose 110 (H) 65 - 99 mg/dL   BUN 21 6 - 24 mg/dL   Creatinine, Ser 1.15 0.76 - 1.27 mg/dL   GFR calc non Af Amer 70 >59 mL/min/1.73   GFR calc Af Amer 81 >59 mL/min/1.73   BUN/Creatinine Ratio 18 9 - 20   Sodium 144 134 - 144 mmol/L   Potassium 3.9 3.5 - 5.2 mmol/L   Chloride 107 (H) 96 - 106 mmol/L   CO2 21 20 - 29 mmol/L   Calcium 9.1 8.7 - 10.2 mg/dL   Total Protein 6.3 6.0 - 8.5 g/dL   Albumin 4.2 3.5 - 5.5 g/dL   Globulin, Total 2.1 1.5 - 4.5 g/dL   Albumin/Globulin Ratio 2.0 1.2 - 2.2   Bilirubin Total 0.3 0.0 - 1.2 mg/dL   Alkaline Phosphatase 58 39 - 117 IU/L   AST 28 0 -  40 IU/L   ALT 25 0 - 44 IU/L  TSH  Result Value Ref Range   TSH 1.380 0.450 - 4.500 uIU/mL  14.3.3 ETA, Rheum. Arthritis  Result Value Ref Range   14.3.3 ETA, Rheum. Arthritis <0.20 ng/mL  Lipid panel  Result Value Ref Range   Cholesterol, Total 151 100 - 199 mg/dL   Triglycerides 219 (H) 0 - 149 mg/dL   HDL 39 (L) >39 mg/dL   VLDL Cholesterol Cal 44 (H) 5 - 40 mg/dL   LDL Calculated 68 0 - 99 mg/dL   Chol/HDL Ratio 3.9 0.0 - 5.0 ratio      Assessment & Plan:   Problem List Items Addressed This Visit      Cardiovascular and Mediastinum   Essential hypertension - Primary    Try to eat less sodium; start amlodipine; return in 2 weeks      Relevant Medications   amLODipine (NORVASC) 5 MG tablet   Other Relevant Orders   TSH   Comp Met (CMET)   B12   Vitamin D (25 hydroxy)   Lipid Profile     Endocrine   Hypothyroidism    check level today and start back on medicine      Relevant Orders   TSH   Comp Met (CMET)   B12   Vitamin D (25 hydroxy)   Lipid Profile     Other   Tobacco use    Patient is not ready to quit and is very honest about that; I explained that we are here to help if/when ready to quit; see AVS      Medication monitoring encounter    Check liver and kidneys      Relevant Orders   TSH   Comp Met (CMET)   B12   Vitamin D (25 hydroxy)   Lipid Profile   Hyperlipidemia    Check lipids today; doubtful patient will follow strict dietary guidelines, but encouraged to try a little      Relevant Medications   amLODipine (NORVASC) 5 MG tablet   Other Relevant Orders   TSH   Comp Met (CMET)   B12   Vitamin D (25 hydroxy)   Lipid Profile    Other Visit Diagnoses    Other fatigue       Relevant Orders   CBC   TSH   Comp Met (CMET)   B12   Vitamin D (25 hydroxy)   Lipid Profile  Onychomycosis       Relevant Orders   Ambulatory referral to Podiatry       Follow up plan: No follow-ups on file.  An after-visit summary was printed  and given to the patient at [facility].  Please see the patient instructions which may contain other information and recommendations beyond what is mentioned above in the assessment and plan.  Meds ordered this encounter  Medications  . amLODipine (NORVASC) 5 MG tablet    Sig: Take 1 tablet (5 mg total) by mouth daily.    Dispense:  30 tablet    Refill:  5    Orders Placed This Encounter  Procedures  . CBC  . TSH  . Comp Met (CMET)  . B12  . Vitamin D (25 hydroxy)  . Lipid Profile  . Ambulatory referral to Podiatry

## 2018-03-19 NOTE — Assessment & Plan Note (Signed)
Check liver and kidneys 

## 2018-03-19 NOTE — Progress Notes (Signed)
Subjective:  Patient ID: Wayne Roy, male   DOB: 09/06/1956, 62 y.o.   MRN: [id number]    Increase emotional regulation  [name]  presents with anxiety. Onset of symptoms was several years with gradually improving improving. Symptoms have been occurringNot influenced by the time of the day.  Symptoms are currently rated marked. Associated signs and symptoms include: attention difficulties and highly negativeHealth problems Marital or family conflict.   He reports that he enjoyed his trip to the beach and had a really good time with his mom. He notes that he is a little less irritable this week compared to last week. He explains that he can still get frustrated from time to time over little things. He notes that he has been smoking a pack to a pack in half a day of cigarettes. He reports that he is trying to set boundaries with his family that he is a grown adult who will go where he wants and do what he wants to do without having to answer to anyone. He notes that he is worried about his thyroid and all the health issues that he has been having. He denies suicidal and homicidal thoughts.   Therapeutic Interventions: Cognitive Behavioral therapy was utilized by the clinician focusing on his anxiety and effect on normal cognition. Clinician processed with the patient regarding how he has been doing since the last session. Clinician processed with the patient regarding how his beach trip went. Clinician processed with the patient regarding that it sounds like it was good for him to get away from the drama of the family and have a few days off of work. Clinician explained to the patient that he should slow down on the cigarettes and provided him the contact information for 1-800 QUIT NOW for assistance with quitting smoking. Clinician explained that he cannot control his friends, family, or actions of other people; only himself.   Return visit in 1 week.    Effectiveness:  Established problem,  stable/improving (1). Progressing It is felt more time is needed for Interventions to work.  . Patient is fully or not fully Other:  orientated to time and place. Patient's Appropriate into problems. Active. Thought process is  Coherent.Minimal: No identifiable suicidal ideation.  Patients presenting with no risk factors but with morbid ruminations; may be classified as minimal risk based on the severity of the depressive symptoms and None.   Homework: Practice self care and discuss with doctor overall health problems.  Plan: Follow up with Carey BullocksHeather Vaughn Frieze, LCSW at [facility] in one week or earlier.

## 2018-03-19 NOTE — Assessment & Plan Note (Signed)
Try to eat less sodium; start amlodipine; return in 2 weeks

## 2018-03-19 NOTE — Assessment & Plan Note (Addendum)
check level today and start back on medicine

## 2018-03-19 NOTE — Assessment & Plan Note (Signed)
Check lipids today; doubtful patient will follow strict dietary guidelines, but encouraged to try a little

## 2018-03-19 NOTE — Assessment & Plan Note (Signed)
Patient is not ready to quit and is very honest about that; I explained that we are here to help if/when ready to quit; see AVS

## 2018-03-20 ENCOUNTER — Other Ambulatory Visit: Payer: Self-pay | Admitting: Internal Medicine

## 2018-03-20 DIAGNOSIS — E039 Hypothyroidism, unspecified: Secondary | ICD-10-CM

## 2018-03-20 LAB — COMPREHENSIVE METABOLIC PANEL
ALK PHOS: 61 IU/L (ref 39–117)
ALT: 20 IU/L (ref 0–44)
AST: 26 IU/L (ref 0–40)
Albumin/Globulin Ratio: 2 (ref 1.2–2.2)
Albumin: 4.3 g/dL (ref 3.5–5.5)
BUN / CREAT RATIO: 14 (ref 9–20)
BUN: 14 mg/dL (ref 6–24)
Bilirubin Total: 0.3 mg/dL (ref 0.0–1.2)
CO2: 21 mmol/L (ref 20–29)
CREATININE: 1 mg/dL (ref 0.76–1.27)
Calcium: 9.3 mg/dL (ref 8.7–10.2)
Chloride: 105 mmol/L (ref 96–106)
GFR, EST AFRICAN AMERICAN: 95 mL/min/{1.73_m2} (ref >59)
GFR, EST NON AFRICAN AMERICAN: 82 mL/min/{1.73_m2} (ref >59)
GLOBULIN, TOTAL: 2.2 g/dL (ref 1.5–4.5)
Glucose: 73 mg/dL (ref 65–99)
Potassium: 4.2 mmol/L (ref 3.5–5.2)
SODIUM: 143 mmol/L (ref 134–144)
TOTAL PROTEIN: 6.5 g/dL (ref 6.0–8.5)

## 2018-03-20 LAB — CBC
HEMOGLOBIN: 14.6 g/dL (ref 13.0–17.7)
Hematocrit: 43.3 % (ref 37.5–51.0)
MCH: 31.9 pg (ref 26.6–33.0)
MCHC: 33.7 g/dL (ref 31.5–35.7)
MCV: 95 fL (ref 79–97)
Platelets: 225 10*3/uL (ref 150–379)
RBC: 4.58 x10E6/uL (ref 4.14–5.80)
RDW: 13.2 % (ref 12.3–15.4)
WBC: 7.8 10*3/uL (ref 3.4–10.8)

## 2018-03-20 LAB — LIPID PANEL
CHOL/HDL RATIO: 3.2 ratio (ref 0.0–5.0)
Cholesterol, Total: 147 mg/dL (ref 100–199)
HDL: 46 mg/dL (ref >39)
LDL Calculated: 81 mg/dL (ref 0–99)
TRIGLYCERIDES: 102 mg/dL (ref 0–149)
VLDL CHOLESTEROL CAL: 20 mg/dL (ref 5–40)

## 2018-03-20 LAB — TSH: TSH: 2.25 u[IU]/mL (ref 0.450–4.500)

## 2018-03-20 LAB — VITAMIN D 25 HYDROXY (VIT D DEFICIENCY, FRACTURES): Vit D, 25-Hydroxy: 19.9 ng/mL — ABNORMAL LOW (ref 30.0–100.0)

## 2018-03-20 LAB — VITAMIN B12: VITAMIN B 12: 282 pg/mL (ref 232–1245)

## 2018-03-26 ENCOUNTER — Other Ambulatory Visit: Payer: Self-pay

## 2018-03-26 ENCOUNTER — Ambulatory Visit: Payer: Self-pay | Admitting: Licensed Clinical Social Worker

## 2018-03-26 ENCOUNTER — Ambulatory Visit: Payer: Self-pay

## 2018-03-27 ENCOUNTER — Telehealth: Payer: Self-pay | Admitting: Pharmacy Technician

## 2018-03-27 NOTE — Telephone Encounter (Signed)
Received updated proof of income.  Patient eligible to receive medication assistance at [facility] through 2019, as long as eligibility requirements continue to be met.  [facility]

## 2018-03-28 ENCOUNTER — Ambulatory Visit: Payer: Self-pay

## 2018-03-28 ENCOUNTER — Ambulatory Visit: Payer: Self-pay | Admitting: Licensed Clinical Social Worker

## 2018-03-29 ENCOUNTER — Emergency Department
Admission: EM | Admit: 2018-03-29 | Discharge: 2018-04-01 | Disposition: A | Discharge status: Home / Self Care | Payer: Self-pay | Attending: Emergency Medicine | Admitting: Emergency Medicine

## 2018-03-29 ENCOUNTER — Other Ambulatory Visit: Payer: Self-pay

## 2018-03-29 DIAGNOSIS — F319 Bipolar disorder, unspecified: Secondary | ICD-10-CM | POA: Insufficient documentation

## 2018-03-29 DIAGNOSIS — E039 Hypothyroidism, unspecified: Secondary | ICD-10-CM | POA: Insufficient documentation

## 2018-03-29 DIAGNOSIS — Z9114 Patient's other noncompliance with medication regimen: Secondary | ICD-10-CM | POA: Insufficient documentation

## 2018-03-29 DIAGNOSIS — F17210 Nicotine dependence, cigarettes, uncomplicated: Secondary | ICD-10-CM | POA: Insufficient documentation

## 2018-03-29 DIAGNOSIS — Z7982 Long term (current) use of aspirin: Secondary | ICD-10-CM | POA: Insufficient documentation

## 2018-03-29 DIAGNOSIS — Z91199 Patient's noncompliance with other medical treatment and regimen due to unspecified reason: Secondary | ICD-10-CM

## 2018-03-29 DIAGNOSIS — Z9119 Patient's noncompliance with other medical treatment and regimen: Secondary | ICD-10-CM

## 2018-03-29 DIAGNOSIS — F1210 Cannabis abuse, uncomplicated: Secondary | ICD-10-CM

## 2018-03-29 DIAGNOSIS — F310 Bipolar disorder, current episode hypomanic: Secondary | ICD-10-CM

## 2018-03-29 DIAGNOSIS — I10 Essential (primary) hypertension: Secondary | ICD-10-CM | POA: Insufficient documentation

## 2018-03-29 DIAGNOSIS — Z79899 Other long term (current) drug therapy: Secondary | ICD-10-CM | POA: Insufficient documentation

## 2018-03-29 DIAGNOSIS — I252 Old myocardial infarction: Secondary | ICD-10-CM | POA: Insufficient documentation

## 2018-03-29 LAB — COMPREHENSIVE METABOLIC PANEL
ALBUMIN: 4.5 g/dL (ref 3.5–5.0)
ALT: 33 U/L (ref 17–63)
AST: 40 U/L (ref 15–41)
Alkaline Phosphatase: 55 U/L (ref 38–126)
Anion gap: 6 (ref 5–15)
BUN: 17 mg/dL (ref 6–20)
CHLORIDE: 108 mmol/L (ref 101–111)
CO2: 26 mmol/L (ref 22–32)
Calcium: 9.4 mg/dL (ref 8.9–10.3)
Creatinine, Ser: 0.83 mg/dL (ref 0.61–1.24)
GFR calc Af Amer: >60 mL/min (ref >60)
Glucose, Bld: 104 mg/dL — ABNORMAL HIGH (ref 65–99)
POTASSIUM: 3.7 mmol/L (ref 3.5–5.1)
SODIUM: 140 mmol/L (ref 135–145)
Total Bilirubin: 0.6 mg/dL (ref 0.3–1.2)
Total Protein: 7.6 g/dL (ref 6.5–8.1)

## 2018-03-29 LAB — URINE DRUG SCREEN, QUALITATIVE (ARMC ONLY)
AMPHETAMINES, UR SCREEN: NOT DETECTED
BARBITURATES, UR SCREEN: NOT DETECTED
Benzodiazepine, Ur Scrn: NOT DETECTED
Cannabinoid 50 Ng, Ur ~~LOC~~: POSITIVE — AB
Cocaine Metabolite,Ur ~~LOC~~: NOT DETECTED
MDMA (Ecstasy)Ur Screen: NOT DETECTED
METHADONE SCREEN, URINE: NOT DETECTED
OPIATE, UR SCREEN: NOT DETECTED
PHENCYCLIDINE (PCP) UR S: NOT DETECTED
Tricyclic, Ur Screen: NOT DETECTED

## 2018-03-29 LAB — CBC
HEMATOCRIT: 47.1 % (ref 40.0–52.0)
HEMOGLOBIN: 16.4 g/dL (ref 13.0–18.0)
MCH: 33.4 pg (ref 26.0–34.0)
MCHC: 34.8 g/dL (ref 32.0–36.0)
MCV: 95.9 fL (ref 80.0–100.0)
Platelets: 256 10*3/uL (ref 150–440)
RBC: 4.91 MIL/uL (ref 4.40–5.90)
RDW: 13.5 % (ref 11.5–14.5)
WBC: 9.8 10*3/uL (ref 3.8–10.6)

## 2018-03-29 LAB — ACETAMINOPHEN LEVEL

## 2018-03-29 LAB — SALICYLATE LEVEL: Salicylate Lvl: <7 mg/dL (ref 2.8–30.0)

## 2018-03-29 LAB — ETHANOL: Alcohol, Ethyl (B): <10 mg/dL (ref <10)

## 2018-03-29 MED ORDER — ARIPIPRAZOLE 5 MG PO TABS
10.0000 mg | ORAL_TABLET | Freq: Every day | ORAL | Status: DC
  Administered 2018-03-29 – 2018-04-01 (×3): 10 mg via ORAL
  Filled 2018-03-29 (×3): qty 2

## 2018-03-29 NOTE — ED Notes (Signed)
Pt. Finished SOC, pt. Stated machine shut off on its own.  [facility]

## 2018-03-29 NOTE — ED Triage Notes (Signed)
Pt arrives to ED via [facility] under IVC. IVC documentation states pt "has been dx with bipolar d/o" and noncompliant with medications. IVC also states pt "has been talking to himself and to the walls..frightens his mother and step-father. Respondent can become combative" and family members are fearful of staying with him. Pt is calm and cooperative, denies any c/o pain, no reports of SI or HI.

## 2018-03-29 NOTE — ED Notes (Signed)
SOC called and report was given. 

## 2018-03-29 NOTE — BH Assessment (Signed)
TTS Unable to complete assessment at this time. [facility]

## 2018-03-29 NOTE — ED Notes (Signed)
Pt. Mother called to inquire about patient.  Mother told information on patient could no be given out.  Mother states she and step father are in fear of patient.  [facility]. Not keeping appointments and not taking his medications.

## 2018-03-29 NOTE — ED Provider Notes (Signed)
[facility] Emergency Department Provider Note  Time seen: 7:58 PM  I have reviewed the triage vital signs and the nursing notes.   HISTORY  Chief Complaint Psychiatric Evaluation    HPI Wayne Roy is a 62 y.o. male with a past medical history of bipolar, hypertension, MI, presents to the emergency department under IVC.  According to the IVC the patient has not been taking his medications, has been talking to the walls, and family is fearful of the patient.  Here the patient is calm, cooperative, states he is here because his family made him come.  Understands he is under IVC.  But denies talking to walls, denies any threatening behavior, states he is not sure why he is having to come here.  Patient has no medical concerns largely negative review of systems.   Past Medical History:  Diagnosis Date  . Arthritis   . Bipolar 1 disorder (HCC)   . Hypertension   . Hypothyroidism   . Myocardial infarction [facility](HCC)     Patient Active Problem List   Diagnosis Date Noted  . Hyperlipidemia 03/19/2018  . Medication monitoring encounter 03/19/2018  . Tobacco use 03/19/2018  . Back pain 02/15/2017  . Bipolar disorder (HCC) 10/24/2016  . Essential hypertension 01/20/2016  . Hypothyroidism 01/20/2016  . Chest pain 01/20/2016    History reviewed. No pertinent surgical history.  Prior to Admission medications   Medication Sig Start Date End Date Taking? Authorizing Provider  acetaminophen (TYLENOL) 500 MG tablet Take 1,000 mg by mouth every 6 (six) hours as needed for mild pain or headache.    [provider]  amLODipine (NORVASC) 5 MG tablet Take 1 tablet (5 mg total) by mouth daily. 03/19/18   Kerman PasseyLada, Melinda P, MD  ARIPiprazole (ABILIFY) 10 MG tablet Take 10 mg by mouth daily.    [provider]  aspirin EC 81 MG tablet Take 81 mg by mouth daily.    [provider]  busPIRone (BUSPAR) 15 MG tablet Take 30 mg by mouth 2 (two) times daily.     [provider]  cyclobenzaprine (FLEXERIL) 10 MG tablet TAKE ONE TABLET BY MOUTH 2 TIMES A DAY AS NEEDED FOR MUSCLE SPASMS 03/20/18   Virl Axehaplin, Don C, MD  levothyroxine (SYNTHROID, LEVOTHROID) 25 MCG tablet TAKE 1 1/2 TABLETS (37.5MG ) BY MOUTH DAILY 03/20/18   Virl Axehaplin, Don C, MD  mirtazapine (REMERON) 30 MG tablet Take 30 mg by mouth at bedtime. Reported on 02/24/2016    [provider]  polyethylene glycol (MIRALAX / GLYCOLAX) packet Take 17 g by mouth daily as needed for mild constipation.    [provider]  ranitidine (ZANTAC) 150 MG tablet TAKE 1 TABLET BY MOUTH 2 TIMES A DAY 03/20/18   Virl Axehaplin, Don C, MD  venlafaxine XR (EFFEXOR-XR) 150 MG 24 hr capsule Take 300 mg by mouth daily with breakfast.    [provider]    Allergies  Allergen Reactions  . Saphris [Asenapine] Other (See Comments)    Other reaction(s): Other (See Comments) Reaction:Sores in pts mouth, nose, and ears Reaction:  Sores in pts mouth, nose, and ears    Family History  Problem Relation Age of Onset  . Arthritis Mother   . Bipolar disorder Mother   . Hypertension Sister     Social History Social History   Tobacco Use  . Smoking status: Current Every Day Smoker    Packs/day: 0.50    Years: 45.00    Pack years:  22.50  . Smokeless tobacco: Never Used  . Tobacco comment: 1 pack every 3 days  Substance Use Topics  . Alcohol use: No  . Drug use: No    Review of Systems Constitutional: Negative for fever. Eyes: Negative for visual complaints ENT: States mild congestion over the past several days. Cardiovascular: Negative for chest pain. Respiratory: Negative for shortness of breath. Gastrointestinal: Negative for abdominal pain, vomiting and diarrhea. Genitourinary: Negative for urinary compaints Musculoskeletal: Negative for musculoskeletal complaints Skin: Negative for skin complaints  Neurological: Negative for headache All other ROS  negative  ____________________________________________   PHYSICAL EXAM:  VITAL SIGNS: ED Triage Vitals [03/29/18 1930]  Enc Vitals Group     BP (!) 169/94     Pulse Rate (!) 102     Resp 18     Temp 98.6 F (37 C)     Temp Source Oral     SpO2 99 %     Weight 207 lb (93.9 kg)     Height 5\' 6"  (1.676 m)     Head Circumference      Peak Flow      Pain Score 0     Pain Loc      Pain Edu?      Excl. in GC?    Constitutional: Alert and oriented. Well appearing and in no distress. Eyes: Normal exam ENT   Head: Normocephalic and atraumatic.   Mouth/Throat: Mucous membranes are moist. Cardiovascular: Normal rate, regular rhythm. No murmur Respiratory: Normal respiratory effort without tachypnea nor retractions. Breath sounds are clear Gastrointestinal: Soft and nontender. No distention.  Musculoskeletal: Nontender with normal range of motion in all extremities.  Neurologic:  Normal speech and language. No gross focal neurologic deficits  Skin:  Skin is warm, dry and intact.  Psychiatric: Mood and affect are normal.  Denies SI or HI.   INITIAL IMPRESSION / ASSESSMENT AND PLAN / ED COURSE  Pertinent labs & imaging results that were available during my care of the patient were reviewed by me and considered in my medical decision making (see chart for details).  Patient presents emergency department under IVC for medication noncompliance talking to the walls and threatening behaviors per family.  Here the patient appears well, is calm and cooperative, no distress has no medical complaints.  Given the IVC complaints we will maintain the IVC until the patient can be adequately evaluated by psychiatry.  I placed a telemetry psychiatry evaluation consultation as well as checking basic labs for the patient.  He denies any alcohol or drug use.  Specialist on-call has recommended admission for the patient.  Recommend starting Abilify 10 mg  daily.  ____________________________________________   FINAL CLINICAL IMPRESSION(S) / ED DIAGNOSES  Medication noncompliance Bipolar    Minna Antis, MD 03/29/18 2203

## 2018-03-29 NOTE — ED Notes (Signed)
SOC recommends inpatient services. 

## 2018-03-30 NOTE — ED Notes (Signed)
Pt irritable. "How long am I going to be in this damn place. I don't belong here, my family put me in here."   Maintained on 15 minute checks and observation by security camera for safety.

## 2018-03-30 NOTE — BH Assessment (Signed)
TTS learned that pt became even more aggressive towards after previous attempt to assess. Pt now resting.

## 2018-03-30 NOTE — ED Notes (Signed)
Pt to be admitted to inpt psych unit for treatment [facility]  Pt is [facility]C  Refused med this am  Assessment completed

## 2018-03-30 NOTE — ED Notes (Signed)
ivc/pending placement.. 

## 2018-03-30 NOTE — ED Notes (Addendum)
Pt has taken a shower.  Maintained on 15 minute checks and observation by security camera for safety. 

## 2018-03-30 NOTE — ED Notes (Signed)
Pt. Woke up due to bed alarm going off.

## 2018-03-30 NOTE — ED Notes (Signed)

## 2018-03-30 NOTE — ED Notes (Signed)
IVC, psych pending 

## 2018-03-30 NOTE — BH Assessment (Signed)
Patient's referral information faxed to:   [facility]    [address] [place] [address] Phone: [phone] Fax: [phone] [facility]    [address], [place] [address] Phone: [phone] Fax: [phone] [facility]    [address] [place] [address] Phone: [phone] Fax: [phone] [facility]   [address] [place] [address] Phone: [phone] Fax: [phone] [facility]    [address] [place] [address] Phone: [phone] Fax: [phone] [facility]   [address] [place] [address] Phone: [phone] Fax: [phone] [facility]   [address] [place] [address] Phone: [phone] Fax: [phone] [facility]   [address] [place] [address] Phone: [phone] Fax: [phone] [facility]    [address], [place] [address] Phone: [phone] Fax: [phone] [facility]    [address] [place] [address] Phone: [phone] Fax: [phone] [facility]    [address] [place] [address] Phone: [phone] Fax: [phone] [facility]   [address] [place] [address] Phone: [phone] Fax: [phone] [facility]    [address] [place]  [address] Phone: [phone] Fax: [phone] [facility]   [address] [place]  [address] Phone: [phone] Fax: [phone] 

## 2018-03-30 NOTE — ED Notes (Signed)
BEHAVIORAL HEALTH ROUNDING Patient sleeping: No. Patient alert and oriented: yes Behavior appropriate: Yes.  ; If no, describe:  Nutrition and fluids offered: yes Toileting and hygiene offered: Yes  Sitter present: q15 minute observations and security monitoring Law enforcement present: Yes    

## 2018-03-30 NOTE — ED Notes (Signed)
Patient observed lying in bed with eyes closed  Even, unlabored respirations observed   NAD pt appears to be sleeping  I will continue to monitor along with every 15 minute visual observations and ongoing security monitoring    

## 2018-03-30 NOTE — ED Notes (Signed)
ED  Is the patient under IVC or is there intent for IVC: Yes.   Is the patient medically cleared: Yes.   Is there vacancy in the ED BHU: Yes.   Is the population mix appropriate for patient: Yes.   Is the patient awaiting placement in inpatient or outpatient setting: Yes.   Has the patient had a psychiatric consult: Yes.   Survey of unit performed for contraband, proper placement and condition of furniture, tampering with fixtures in bathroom, shower, and each patient room: Yes.  ; Findings:  APPEARANCE/BEHAVIOR Agitated - angry NEURO ASSESSMENT Orientation: oriented x3  Denies pain Hallucinations: No.None noted (Hallucinations) denies  Speech: Normal Gait: normal RESPIRATORY ASSESSMENT Even  Unlabored respirations  CARDIOVASCULAR ASSESSMENT Pulses equal   regular rate  Skin warm and dry   GASTROINTESTINAL ASSESSMENT no GI complaint EXTREMITIES Full ROM  PLAN OF CARE Provide calm/safe environment. Vital signs assessed twice daily. ED BHU Assessment once each 12-hour shift. Collaborate with TTS daily or as condition indicates. Assure the ED provider has rounded once each shift. Provide and encourage hygiene. Provide redirection as needed. Assess for escalating behavior; address immediately and inform ED provider.  Assess family dynamic and appropriateness for visitation as needed: Yes.  ; If necessary, describe findings:  Educate the patient/family about BHU procedures/visitation: Yes.  ; If necessary, describe findings:

## 2018-03-31 NOTE — ED Provider Notes (Signed)
-----------------------------------------   6:50 AM on 03/31/2018 -----------------------------------------   Blood pressure 135/73, pulse 92, temperature 98.3 F (36.8 C), temperature source Oral, resp. rate 18, height 5\' 6"  (1.676 m), weight 93.9 kg (207 lb), SpO2 99 %.  The patient had no acute events since last update.  Calm and cooperative at this time.  Disposition is pending Psychiatry/Behavioral Medicine team recommendations.     Irean HongSung, Jadan Hinojos J, MD 03/31/18 [phone]

## 2018-03-31 NOTE — ED Notes (Signed)
Hourly rounding reveals patient in day room. No complaints, stable, in no acute distress. Q15 minute rounds and monitoring via Security Cameras to continue. 

## 2018-03-31 NOTE — ED Notes (Signed)
ED BHU PLACEMENT JUSTIFICATION Is the patient under IVC or is there intent for IVC: Yes.   Is the patient medically cleared: Yes.   Is there vacancy in the ED BHU: Yes.   Is the population mix appropriate for patient: Yes.   Is the patient awaiting placement in inpatient or outpatient setting: Yes.   Has the patient had a psychiatric consult: Yes.   Survey of unit performed for contraband, proper placement and condition of furniture, tampering with fixtures in bathroom, shower, and each patient room: Yes.   APPEARANCE/BEHAVIOR adequate rapport can be established NEURO ASSESSMENT Orientation: time, place and person Hallucinations: No.None noted (Hallucinations) Speech: Normal Gait: normal RESPIRATORY ASSESSMENT Normal expansion.  Clear to auscultation.  No rales, rhonchi, or wheezing. CARDIOVASCULAR ASSESSMENT regular rate and rhythm, S1, S2 normal, no murmur, click, rub or gallop GASTROINTESTINAL ASSESSMENT soft, nontender, BS WNL, no r/g EXTREMITIES normal strength, tone, and muscle mass PLAN OF CARE Provide calm/safe environment. Vital signs assessed twice daily. ED BHU Assessment once each 12-hour shift. Collaborate with intake RN daily or as condition indicates. Assure the ED provider has rounded once each shift. Provide and encourage hygiene. Provide redirection as needed. Assess for escalating behavior; address immediately and inform ED provider.  Assess family dynamic and appropriateness for visitation as needed: Yes.   Educate the patient/family about BHU procedures/visitation: Yes.   

## 2018-03-31 NOTE — ED Notes (Signed)
Hourly rounding reveals patient sleeping in room. No complaints, stable, in no acute distress. Q15 minute rounds and monitoring via Security Cameras to continue. 

## 2018-03-31 NOTE — ED Notes (Signed)
Hourly rounding reveals patient in room. No complaints, stable, in no acute distress. Q15 minute rounds and monitoring via Security Cameras to continue. 

## 2018-03-31 NOTE — BH Assessment (Addendum)
Per Rep, [facility]

## 2018-03-31 NOTE — ED Notes (Signed)
Patient received PM snack. 

## 2018-03-31 NOTE — ED Notes (Signed)
Patient asleep in room. No noted distress or abnormal behavior. Will continue 15 minute checks and observation by security cameras for safety. 

## 2018-03-31 NOTE — ED Notes (Signed)
IVC, psych says inpatient

## 2018-03-31 NOTE — ED Notes (Signed)
Pt. Is up from bed walked outside of room to stretch and returned to room and bed.

## 2018-03-31 NOTE — ED Notes (Signed)
Pt is taking a shower. Pt continues to be rude and demanding. Maintained on 15 minute checks and observation by security camera for safety.

## 2018-03-31 NOTE — BH Assessment (Signed)
Pt denied at [facility] due to no available beds. Writer spoke with Heidi 

## 2018-03-31 NOTE — ED Notes (Signed)
Report to include Situation, Background, Assessment, and Recommendations received from Amy B. RN. Patient alert and oriented, warm and dry, in no acute distress. Patient denies SI, HI, AVH and pain. Patient made aware of Q15 minute rounds and security cameras for their safety. Patient instructed to come to me with needs or concerns. 

## 2018-04-01 DIAGNOSIS — F310 Bipolar disorder, current episode hypomanic: Secondary | ICD-10-CM

## 2018-04-01 DIAGNOSIS — F1210 Cannabis abuse, uncomplicated: Secondary | ICD-10-CM

## 2018-04-01 DIAGNOSIS — Z9119 Patient's noncompliance with other medical treatment and regimen: Secondary | ICD-10-CM

## 2018-04-01 DIAGNOSIS — Z91199 Patient's noncompliance with other medical treatment and regimen due to unspecified reason: Secondary | ICD-10-CM

## 2018-04-01 NOTE — ED Notes (Signed)
Hourly rounding reveals patient in room. No complaints, stable, in no acute distress. Q15 minute rounds and monitoring via Security Cameras to continue. 

## 2018-04-01 NOTE — ED Notes (Signed)
IVC  PAPERS  RESCINDED  PER  DR  CLAPACS  MD  INFORMED  RN  WENDY 

## 2018-04-01 NOTE — ED Notes (Signed)
Patient ate 100% of lunch and beverage.  

## 2018-04-01 NOTE — Consult Note (Signed)
[facility] Face-to-Face Psychiatry Consult   Reason for Consult: Consult for 62 year old man put under petition by his family with claims that he has been talking to himself and that they are afraid of him. Referring Physician: Juliette Alcide Patient Identification: Wayne Roy MRN:  [id number] Principal Diagnosis: Bipolar affective disorder, current episode hypomanic [facility]) Diagnosis:   Patient Active Problem List   Diagnosis Date Noted  . Bipolar affective disorder, current episode hypomanic (HCC) [F31.0] 04/01/2018  . Cannabis abuse [F12.10] 04/01/2018  . Noncompliance [Z91.19] 04/01/2018  . Hyperlipidemia [E78.5] 03/19/2018  . Medication monitoring encounter [Z51.81] 03/19/2018  . Tobacco use [Z72.0] 03/19/2018  . Back pain [M54.9] 02/15/2017  . Bipolar disorder (HCC) [F31.9] 10/24/2016  . Essential hypertension [I10] 01/20/2016  . Hypothyroidism [E03.9] 01/20/2016  . Chest pain [R07.9] 01/20/2016    Total Time spent with patient: 1 hour  Subjective:   Wayne Roy is a 62 y.o. male patient admitted with "family problems"  HPI: Patient seen chart reviewed.  62 year old man brought in under petition filed by his family.  They report that they are afraid of him but I do not see any details of specific accusations of violence.  Patient was described as being irritable and angry and somewhat labile when he first came to the emergency room but was not violent.  On reinterviewed today patient is calm and appropriately interactive during the interview.  He still has no insight and puts all of the blame for his problems on his mother who he says he has never gotten along with.  Patient admits that he has poor sleep patterns but says that is because he is used to sleeping during the daytime when he works third shift.  He denies being depressed denies suicidal or homicidal thoughts denies any hallucinations.  He admits that he talks to himself but says he never hears anything talking back to him.  He  admits that he has been noncompliant with recommended psychiatric treatment because he does not think that it helps him although he has been going to see counselors.  Medical history: History of multiple medical problems hypothyroidism apparently coronary artery disease dyslipidemia.  Social history: Patient has been out of prison for a couple of years.  He says he is living with his stepfather but not his mother although he says his mother keeps "interfering" in his life.  Patient apparently was in prison for indecent liberties after some inappropriate sexual behavior with a 47 year old.  Substance abuse history: Says he has not been drinking.  He admits to using marijuana minimizes any history of substance use.  Past Psychiatric History: Patient has been diagnosed with bipolar disorder.  He says that he has been to [facility] in the past but that it was for alcohol abuse.  He has been referred to [facility] recently and was being prescribed Abilify but has not been taking it.  Past history of multiple other antidepressants and mood stabilizers none of which she said really helped him.  Denies ever having tried to kill himself in the past.  Denies any history of violence.  Risk to Self: Is patient at risk for suicide?: No Risk to Others:   Prior Inpatient Therapy:   Prior Outpatient Therapy:    Past Medical History:  Past Medical History:  Diagnosis Date  . Arthritis   . Bipolar 1 disorder (HCC)   . Hypertension   . Hypothyroidism   . Myocardial infarction [facility])    History reviewed. No pertinent surgical history. Family History:  Family History  Problem Relation Age of Onset  . Arthritis Mother   . Bipolar disorder Mother   . Hypertension Sister    Family Psychiatric  History: He says his younger sister has tried to kill herself Social History:  Social History   Substance and Sexual Activity  Alcohol Use No     Social History   Substance and Sexual Activity  Drug Use No    Social  History   Socioeconomic History  . Marital status: Divorced    Spouse name: Not on file  . Number of children: Not on file  . Years of education: Not on file  . Highest education level: Not on file  Occupational History  . Not on file  Social Needs  . Financial resource strain: Not on file  . Food insecurity:    Worry: Not on file    Inability: Not on file  . Transportation needs:    Medical: Not on file    Non-medical: Not on file  Tobacco Use  . Smoking status: Current Every Day Smoker    Packs/day: 0.50    Years: 45.00    Pack years: 22.50  . Smokeless tobacco: Never Used  . Tobacco comment: 1 pack every 3 days  Substance and Sexual Activity  . Alcohol use: No  . Drug use: No  . Sexual activity: Not Currently  Lifestyle  . Physical activity:    Days per week: Not on file    Minutes per session: Not on file  . Stress: Not on file  Relationships  . Social connections:    Talks on phone: Not on file    Gets together: Not on file    Attends religious service: Not on file    Active member of club or organization: Not on file    Attends meetings of clubs or organizations: Not on file    Relationship status: Not on file  Other Topics Concern  . Not on file  Social History Narrative   In prison for 10 years   Additional Social History:    Allergies:   Allergies  Allergen Reactions  . Saphris [Asenapine] Other (See Comments)    Other reaction(s): Other (See Comments) Reaction:Sores in pts mouth, nose, and ears Reaction:  Sores in pts mouth, nose, and ears    Labs: No results found for this or any previous visit (from the past 48 hour(s)).  Current Facility-Administered Medications  Medication Dose Route Frequency Provider Last Rate Last Dose  . ARIPiprazole (ABILIFY) tablet 10 mg  10 mg Oral Daily Minna AntisPaduchowski, Kevin, MD   10 mg at 04/01/18 [id number]   Current Outpatient Medications  Medication Sig Dispense Refill  . amLODipine (NORVASC) 5 MG tablet Take 1  tablet (5 mg total) by mouth daily. (Patient not taking: Reported on 03/30/2018) 30 tablet 5  . ARIPiprazole (ABILIFY) 10 MG tablet Take 10 mg by mouth daily.    . busPIRone (BUSPAR) 15 MG tablet Take 30 mg by mouth 2 (two) times daily.    . cyclobenzaprine (FLEXERIL) 10 MG tablet TAKE ONE TABLET BY MOUTH 2 TIMES A DAY AS NEEDED FOR MUSCLE SPASMS (Patient not taking: Reported on 03/30/2018) 60 tablet 0  . levothyroxine (SYNTHROID, LEVOTHROID) 25 MCG tablet TAKE 1 1/2 TABLETS (37.5MG ) BY MOUTH DAILY (Patient not taking: Reported on 03/30/2018) 45 tablet 0  . mirtazapine (REMERON) 30 MG tablet Take 30 mg by mouth at bedtime. Reported on 02/24/2016    . ranitidine (ZANTAC) 150 MG tablet  TAKE 1 TABLET BY MOUTH 2 TIMES A DAY (Patient not taking: Reported on 03/30/2018) 180 tablet 0  . venlafaxine XR (EFFEXOR-XR) 150 MG 24 hr capsule Take 300 mg by mouth daily with breakfast.      Musculoskeletal: Strength & Muscle Tone: within normal limits Gait & Station: normal Patient leans: N/A  Psychiatric Specialty Exam: Physical Exam  Nursing note and vitals reviewed. Constitutional: He appears well-developed and well-nourished.  HENT:  Head: Normocephalic and atraumatic.  Eyes: Pupils are equal, round, and reactive to light. Conjunctivae are normal.  Neck: Normal range of motion.  Cardiovascular: Regular rhythm and normal heart sounds.  Respiratory: Effort normal. No respiratory distress.  GI: Soft.  Musculoskeletal: Normal range of motion.  Neurological: He is alert.  Skin: Skin is warm and dry.  Psychiatric: He has a normal mood and affect. His speech is normal and behavior is normal. Thought content normal. Cognition and memory are impaired. He expresses impulsivity.    Review of Systems  Constitutional: Negative.   HENT: Negative.   Eyes: Negative.   Respiratory: Negative.   Cardiovascular: Negative.   Gastrointestinal: Negative.   Musculoskeletal: Negative.   Skin: Negative.   Neurological:  Negative.   Psychiatric/Behavioral: Negative for depression, hallucinations, memory loss, substance abuse and suicidal ideas. The patient is nervous/anxious and has insomnia.     Blood pressure 134/78, pulse 65, temperature 98 F (36.7 C), temperature source Oral, resp. rate 18, height 5\' 6"  (1.676 m), weight 93.9 kg (207 lb), SpO2 100 %.Body mass index is 33.41 kg/m.  General Appearance: Disheveled  Eye Contact:  Good  Speech:  Clear and Coherent  Volume:  Normal  Mood:  Euthymic  Affect:  Congruent  Thought Process:  Goal Directed  Orientation:  Full (Time, Place, and Person)  Thought Content:  Rumination  Suicidal Thoughts:  No  Homicidal Thoughts:  No  Memory:  Immediate;   Fair Recent;   Fair Remote;   Fair  Judgement:  Impaired  Insight:  Shallow  Psychomotor Activity:  Decreased  Concentration:  Concentration: Fair  Recall:  [employer] of Knowledge:  Fair  Language:  Good  Akathisia:  No  Handed:  Right  AIMS (if indicated):     Assets:  Communication Skills Desire for Improvement Housing  ADL's:  Impaired  Cognition:  WNL  Sleep:        Treatment Plan Summary: Plan This is a 62 year old man with a history of bipolar disorder who seems to currently be hypomanic if anything.  He has been calm for the last day at least.  He denies any suicidal or homicidal ideation.  Although he was put under commitment there is inadequate evidence that he is actually acutely dangerous in any way.  I do not think he meets commitment criteria.  I educated the patient about bipolar disorder and the importance of staying on medication in order to improve his functioning and stay safe.  He is strongly encouraged to continue follow-up with [facility] and outpatient providers.  Strongly encouraged to discontinue cannabis abuse and will be referred for outpatient substance abuse treatment.  Discontinue IVC as no longer appropriate.  Case reviewed with TTS and emergency room  physician.  Disposition: No evidence of imminent risk to self or others at present.   Patient does not meet criteria for psychiatric inpatient admission. Supportive therapy provided about ongoing stressors. Discussed crisis plan, support from social network, calling 911, coming to the Emergency Department, and calling Suicide Hotline.  Jonny Ruiz  Zakariya Knickerbocker, MD 04/01/2018 1:02 PM

## 2018-04-01 NOTE — ED Notes (Signed)
Nurse talked to patient in length, and He is blaming His 255 year old mom with having to be here, but states that their relationship has always been strained, and she has been addicted to pills for as long as He can remember, states that he would like to just sever all ties with her and that he would be better off, also He talked about His inability to find work because He has a felony from 30 years ago for indecent behavior with a minor, He opened up about that episode, and how He was under the influence of pills himself and that she would flaunt herself in front of him , he denies having sex with her, but did admit that he was inappropriate and said He apologized to family and plead guilty, He said He had to live with it the rest of His life, Patient seems sincere in His regret. Patient has insight into His situation, denies SI/hi or av, even states that He wants to forgive His mom , but just stay away from her and start a new life even though He states it will be hard at 62 years old, Nurse talked to him about inner strength, peace and hope, and tips about jobs. Patient is cooperative and nurse will continue to monitor q15 minute checks and camera surveillance in progress for safety.

## 2018-04-01 NOTE — ED Provider Notes (Signed)
-----------------------------------------   6:50 AM on 04/01/2018 -----------------------------------------   Blood pressure 134/78, pulse 65, temperature 98 F (36.7 C), temperature source Oral, resp. rate 18, height 5\' 6"  (1.676 m), weight 93.9 kg (207 lb), SpO2 100 %.  The patient had no acute events since last update.  Calm and cooperative at this time.  Disposition is pending Psychiatry/Behavioral Medicine team recommendations.     Irean HongSung, Jade J, MD 04/01/18 [phone]

## 2018-04-01 NOTE — ED Notes (Signed)
Patient voices understanding of discharge instructions, all belongings given back to Patient, and a friend is going to transport him home, Patient is calm, no signs of distress, just saying that He wants to disown His mom and start over.

## 2018-04-01 NOTE — ED Notes (Signed)
Hourly rounding reveals patient sleeping in room. No complaints, stable, in no acute distress. Q15 minute rounds and monitoring via Security Cameras to continue. 

## 2018-04-01 NOTE — ED Notes (Signed)
Patient is taking a shower.

## 2018-04-01 NOTE — ED Notes (Addendum)
Pt. Watching TV

## 2018-04-01 NOTE — BH Assessment (Signed)
Assessment Note  Wayne Roy is an 62 y.o. male who presents to the ER due to his family having concerns about his behaviors. Per the IVC, the patient have a history of Bipolar and his recent behaviors have been bizarre. He's talking to the walls, refrigerator and himself. IVC also states the patient can become combative and his family is afraid of him. Per the report of the patient, he's unsure why he is in the ER. He further states, he spoke with his stepfather and the most he told him was "I wasn't myself." Patient denies SI/HI and AV/H. He admits to alcohol and cannabis use. He minimize the use and frequency. He denies SI. He states, "I want to live and be happy. I Don't want to kill myself.  During the interview, the patient was calm, cooperative and pleasant. He was able to provide appropriate answers to the questions. He denies the use of any other mind-altering substance, other than alcohol and cannabis.   Diagnosis: Bipolar  Past Medical History:  Past Medical History:  Diagnosis Date  . Arthritis   . Bipolar 1 disorder (HCC)   . Hypertension   . Hypothyroidism   . Myocardial infarction [facility])     History reviewed. No pertinent surgical history.  Family History:  Family History  Problem Relation Age of Onset  . Arthritis Mother   . Bipolar disorder Mother   . Hypertension Sister     Social History:  reports that he has been smoking.  He has a 22.50 pack-year smoking history. He has never used smokeless tobacco. He reports that he does not drink alcohol or use drugs.  Additional Social History:  Alcohol / Drug Use Pain Medications: See PTA Prescriptions: See PTA Over the Counter: See PTA History of alcohol / drug use?: Yes  CIWA: CIWA-Ar BP: 134/78 Pulse Rate: 65 COWS:    Allergies:  Allergies  Allergen Reactions  . Saphris [Asenapine] Other (See Comments)    Other reaction(s): Other (See Comments) Reaction:Sores in pts mouth, nose, and ears Reaction:  Sores  in pts mouth, nose, and ears    Home Medications:  (Not in a hospital admission)  OB/GYN Status:  No LMP for male patient.  General Assessment Data Location of Assessment: [place] ED TTS Assessment: In system Is this a Tele or Face-to-Face Assessment?: Face-to-Face Is this an Initial Assessment or a Re-assessment for this encounter?: Initial Assessment Marital status: Married [place] name: n/a Is patient pregnant?: No Pregnancy Status: No Living Arrangements: Other (Comment)(Friend) Can pt return to current living arrangement?: Yes Admission Status: Involuntary Is patient capable of signing voluntary admission?: No(Under IVC) Referral Source: Self/Family/Friend Insurance type: None  Medical Screening Exam [facility] Walk-in ONLY) Medical Exam completed: Yes  Crisis Care Plan Living Arrangements: Other (Comment)(Friend) Legal Guardian: Other:(Self) Name of Psychiatrist: [employer] Name of Therapist: [employer] Status Is patient currently in school?: No Is the patient employed, unemployed or receiving disability?: Unemployed  Risk to self with the past 6 months Suicidal Ideation: No Has patient been a risk to self within the past 6 months prior to admission? : No Suicidal Intent: No Has patient had any suicidal intent within the past 6 months prior to admission? : No Is patient at risk for suicide?: No Suicidal Plan?: No Has patient had any suicidal plan within the past 6 months prior to admission? : No Access to Means: No What has been your use of drugs/alcohol within the last 12 months?: Alcohol &  Cannabis Previous Attempts/Gestures: No How many times?: 0 Other Self Harm Risks: Reports of none Triggers for Past Attempts: None known Intentional Self Injurious Behavior: None Family Suicide History: Unknown Recent stressful life event(s): Other (Comment), Job Loss Persecutory voices/beliefs?: No Depression: Yes Depression  Symptoms: Guilt, Isolating, Feeling worthless/self pity Substance abuse history and/or treatment for substance abuse?: Yes Suicide prevention information given to non-admitted patients: Not applicable  Risk to Others within the past 6 months Homicidal Ideation: No Does patient have any lifetime risk of violence toward others beyond the six months prior to admission? : No Thoughts of Harm to Others: No Current Homicidal Intent: No Current Homicidal Plan: No Access to Homicidal Means: No Identified Victim: Reports of none History of harm to others?: No Assessment of Violence: None Noted Violent Behavior Description: Reports of none Does patient have access to weapons?: No Criminal Charges Pending?: No Does patient have a court date: No Is patient on probation?: No  Psychosis Hallucinations: None noted Delusions: None noted  Mental Status Report Appearance/Hygiene: Unremarkable, In scrubs Eye Contact: Fair Motor Activity: Freedom of movement, Unremarkable Speech: Logical/coherent, Unremarkable Level of Consciousness: Alert Mood: Pleasant Affect: Appropriate to circumstance, Depressed Anxiety Level: None Thought Processes: Coherent, Relevant Judgement: Unimpaired Orientation: Person, Place, Time, Situation, Appropriate for developmental age Obsessive Compulsive Thoughts/Behaviors: Minimal  Cognitive Functioning Concentration: Normal Memory: Recent Intact, Remote Intact Is patient IDD: No Is patient DD?: No Insight: Fair Impulse Control: Fair Appetite: Fair Have you had any weight changes? : No Change Sleep: No Change Total Hours of Sleep: 8 Vegetative Symptoms: None  ADLScreening [facility] Assessment Services) Patient's cognitive ability adequate to safely complete daily activities?: Yes Patient able to express need for assistance with ADLs?: Yes Independently performs ADLs?: Yes (appropriate for developmental age)  Prior Inpatient Therapy Prior Inpatient Therapy:  Yes Prior Therapy Dates: 2009 Prior Therapy Facilty/Provider(s): [facility] Reason for Treatment: Alcohol Use Disorder  Prior Outpatient Therapy Prior Outpatient Therapy: Yes Prior Therapy Dates: Current Prior Therapy Facilty/Provider(s): [employer] Reason for Treatment: Depression Does patient have an ACCT team?: No Does patient have Intensive In-House Services?  : No Does patient have [facility] services? : No Does patient have P4CC services?: No  ADL Screening (condition at time of admission) Patient's cognitive ability adequate to safely complete daily activities?: Yes Is the patient deaf or have difficulty hearing?: No Does the patient have difficulty seeing, even when wearing glasses/contacts?: No Does the patient have difficulty concentrating, remembering, or making decisions?: No Patient able to express need for assistance with ADLs?: Yes Does the patient have difficulty dressing or bathing?: No Independently performs ADLs?: Yes (appropriate for developmental age) Does the patient have difficulty walking or climbing stairs?: No Weakness of Legs: None Weakness of Arms/Hands: None  Home Assistive Devices/Equipment Home Assistive Devices/Equipment: None  Therapy Consults (therapy consults require a physician order) PT Evaluation Needed: No OT Evalulation Needed: No SLP Evaluation Needed: No Abuse/Neglect Assessment (Assessment to be complete while patient is alone) Abuse/Neglect Assessment Can Be Completed: Yes Physical Abuse: Denies Verbal Abuse: Denies Sexual Abuse: Denies Exploitation of patient/patient's resources: Denies Self-Neglect: Denies Values / Beliefs Cultural Requests During Hospitalization: None Spiritual Requests During Hospitalization: None Consults Spiritual Care Consult Needed: No Social Work Consult Needed: No         Child/Adolescent Assessment Running Away Risk: Denies(Patient is an adult)  Disposition:  Disposition Initial  Assessment Completed for this Encounter: Yes  On Site Evaluation by:   Reviewed with Physician:    Roetta Sessions.  Kathrynn RunningManning MS, LCAS, LPC, NCC, CCSI Therapeutic Triage Specialist 04/01/2018 3:43 PM

## 2018-04-01 NOTE — Discharge Instructions (Addendum)
Please return for any further problems follow-up with [facility].

## 2018-04-02 ENCOUNTER — Other Ambulatory Visit: Payer: Self-pay

## 2018-04-03 ENCOUNTER — Encounter: Payer: Self-pay | Admitting: Pharmacist

## 2018-04-04 ENCOUNTER — Other Ambulatory Visit: Payer: Self-pay

## 2018-04-09 ENCOUNTER — Ambulatory Visit: Payer: Self-pay | Admitting: Licensed Clinical Social Worker

## 2018-04-09 ENCOUNTER — Telehealth: Payer: Self-pay

## 2018-04-09 NOTE — Telephone Encounter (Signed)
Faxed referral to [facility]

## 2018-04-11 ENCOUNTER — Encounter: Payer: Self-pay | Admitting: Pharmacist

## 2019-07-23 ENCOUNTER — Telehealth: Payer: Self-pay | Admitting: Pharmacy Technician

## 2019-07-23 NOTE — Telephone Encounter (Signed)
Patient failed to provide2020 financial documentation. No additional medication assistance will be provided by [facility] without the required proof of income documentation. Patient notified by letter.  Tarahji Ramthun, CPhT [facility] 

## 2020-04-19 ENCOUNTER — Other Ambulatory Visit: Payer: Self-pay

## 2020-04-19 ENCOUNTER — Ambulatory Visit: Payer: Self-pay | Admitting: Pharmacy Technician

## 2020-04-19 DIAGNOSIS — Z79899 Other long term (current) drug therapy: Secondary | ICD-10-CM

## 2020-04-19 NOTE — Progress Notes (Signed)
Received updated proof of income.  Patient eligible to receive medication assistance at [facility] until time for re-certification in 7308, and as long as eligibility requirements continue to be met.  [place] [facility]

## 2020-04-20 ENCOUNTER — Emergency Department
Admission: EM | Admit: 2020-04-20 | Discharge: 2020-04-20 | Disposition: A | Discharge status: Home / Self Care | Payer: Self-pay | Attending: Emergency Medicine | Admitting: Emergency Medicine

## 2020-04-20 ENCOUNTER — Encounter: Payer: Self-pay | Admitting: Emergency Medicine

## 2020-04-20 ENCOUNTER — Emergency Department: Payer: Self-pay

## 2020-04-20 ENCOUNTER — Other Ambulatory Visit: Payer: Self-pay

## 2020-04-20 DIAGNOSIS — I10 Essential (primary) hypertension: Secondary | ICD-10-CM | POA: Insufficient documentation

## 2020-04-20 DIAGNOSIS — Z760 Encounter for issue of repeat prescription: Secondary | ICD-10-CM

## 2020-04-20 DIAGNOSIS — R079 Chest pain, unspecified: Secondary | ICD-10-CM

## 2020-04-20 DIAGNOSIS — I252 Old myocardial infarction: Secondary | ICD-10-CM | POA: Insufficient documentation

## 2020-04-20 DIAGNOSIS — F17210 Nicotine dependence, cigarettes, uncomplicated: Secondary | ICD-10-CM | POA: Insufficient documentation

## 2020-04-20 DIAGNOSIS — K0889 Other specified disorders of teeth and supporting structures: Secondary | ICD-10-CM

## 2020-04-20 LAB — TSH: TSH: 4.06 u[IU]/mL (ref 0.350–4.500)

## 2020-04-20 LAB — BASIC METABOLIC PANEL
Anion gap: 8 (ref 5–15)
BUN: 14 mg/dL (ref 8–23)
CO2: 24 mmol/L (ref 22–32)
Calcium: 9.1 mg/dL (ref 8.9–10.3)
Chloride: 106 mmol/L (ref 98–111)
Creatinine, Ser: 1.11 mg/dL (ref 0.61–1.24)
GFR calc Af Amer: >60 mL/min (ref >60)
GFR calc non Af Amer: >60 mL/min (ref >60)
Glucose, Bld: 144 mg/dL — ABNORMAL HIGH (ref 70–99)
Potassium: 4.1 mmol/L (ref 3.5–5.1)
Sodium: 138 mmol/L (ref 135–145)

## 2020-04-20 LAB — TROPONIN I (HIGH SENSITIVITY): Troponin I (High Sensitivity): 3 ng/L (ref <18)

## 2020-04-20 LAB — CBC
HCT: 43.7 % (ref 39.0–52.0)
Hemoglobin: 15.5 g/dL (ref 13.0–17.0)
MCH: 32.2 pg (ref 26.0–34.0)
MCHC: 35.5 g/dL (ref 30.0–36.0)
MCV: 90.9 fL (ref 80.0–100.0)
Platelets: 242 10*3/uL (ref 150–400)
RBC: 4.81 MIL/uL (ref 4.22–5.81)
RDW: 13.4 % (ref 11.5–15.5)
WBC: 9.4 10*3/uL (ref 4.0–10.5)
nRBC: 0 % (ref 0.0–0.2)

## 2020-04-20 LAB — T4, FREE: Free T4: 0.77 ng/dL (ref 0.61–1.12)

## 2020-04-20 MED ORDER — FLUTICASONE PROPIONATE 50 MCG/ACT NA SUSP: 1.0000 | Freq: Every day | NASAL | 0 refills | Status: DC

## 2020-04-20 MED ORDER — PENICILLIN V POTASSIUM 250 MG PO TABS: 250.0000 mg | ORAL_TABLET | Freq: Four times a day (QID) | ORAL | 0 refills | Status: DC

## 2020-04-20 MED ORDER — METOPROLOL TARTRATE 25 MG PO TABS: 25.0000 mg | ORAL_TABLET | Freq: Two times a day (BID) | ORAL | 11 refills | Status: DC

## 2020-04-20 MED ORDER — CYCLOBENZAPRINE HCL 10 MG PO TABS: 10.0000 mg | ORAL_TABLET | Freq: Three times a day (TID) | ORAL | 0 refills | Status: DC | PRN

## 2020-04-20 NOTE — ED Provider Notes (Signed)
[facility] Emergency Department Provider Note       Time seen: ----------------------------------------- 2:56 PM on 04/20/2020 -----------------------------------------   I have reviewed the triage vital signs and the nursing notes.  HISTORY   Chief Complaint Chest Pain    HPI Wayne Roy is a 64 y.o. male with a history of arthritis, bipolar disorder, hypertension, hypothyroidism, MI who presents to the ED for dull pain in the right chest that radiates to the right shoulder.  Patient denies shortness of breath, nausea or other symptoms.  Discomfort is 6 out of 10.  Patient states he has only been on a present for about 6 months.  Is also having tooth ache and requesting refill of his medication.  He was taking metoprolol in prison.  Past Medical History:  Diagnosis Date  . Arthritis   . Bipolar 1 disorder ([place])   . Hypertension   . Hypothyroidism   . Myocardial infarction [facility])     Patient Active Problem List   Diagnosis Date Noted  . Bipolar affective disorder, current episode hypomanic ([place]) 04/01/2018  . Cannabis abuse 04/01/2018  . Noncompliance 04/01/2018  . Hyperlipidemia 03/19/2018  . Medication monitoring encounter 03/19/2018  . Tobacco use 03/19/2018  . Back pain 02/15/2017  . Bipolar disorder ([place]) 10/24/2016  . Essential hypertension 01/20/2016  . Hypothyroidism 01/20/2016  . Chest pain 01/20/2016    History reviewed. No pertinent surgical history.  Allergies Saphris [asenapine]  Social History Social History   Tobacco Use  . Smoking status: Current Every Day Smoker    Packs/day: 0.50    Years: 45.00    Pack years: 22.50  . Smokeless tobacco: Never Used  . Tobacco comment: 1 pack every 3 days  Substance Use Topics  . Alcohol use: No  . Drug use: No    Review of Systems Constitutional: Negative for fever. Cardiovascular: Positive for chest pain Respiratory: Negative for shortness of breath. Gastrointestinal:  Negative for abdominal pain, vomiting and diarrhea. Musculoskeletal: Negative for back pain. Skin: Negative for rash. Neurological: Negative for headaches, focal weakness or numbness.  All systems negative/normal/unremarkable except as stated in the HPI  ____________________________________________   PHYSICAL EXAM:  VITAL SIGNS: ED Triage Vitals  Enc Vitals Group     BP 04/20/20 1354 (!) 188/113     Pulse Rate 04/20/20 1354 (!) 120     Resp 04/20/20 1354 18     Temp 04/20/20 1354 98 F (36.7 C)     Temp Source 04/20/20 1354 Oral     SpO2 04/20/20 1354 97 %     Weight 04/20/20 1352 250 lb (113.4 kg)     Height 04/20/20 1352 5\' 10"  (1.778 m)     Head Circumference --      Peak Flow --      Pain Score 04/20/20 1352 6     Pain Loc --      Pain Edu? --      Excl. in [place]? --     Constitutional: Alert and oriented. Well appearing and in no distress. Eyes: Conjunctivae are normal. Normal extraocular movements. ENT      Head: Normocephalic and atraumatic.      Nose: No congestion/rhinnorhea.      Mouth/Throat: Mucous membranes are moist.      Neck: No stridor. Cardiovascular: Normal rate, regular rhythm. No murmurs, rubs, or gallops. Respiratory: Normal respiratory effort without tachypnea nor retractions. Breath sounds are clear and equal bilaterally. No wheezes/rales/rhonchi. Gastrointestinal: Soft and nontender. Normal  bowel sounds Musculoskeletal: Nontender with normal range of motion in extremities. No lower extremity tenderness nor edema.  Tenderness around the right scapula is noted. Neurologic:  Normal speech and language. No gross focal neurologic deficits are appreciated.  Skin:  Skin is warm, dry and intact. No rash noted. Psychiatric: Mood and affect are normal. Speech and behavior are normal.  ____________________________________________  EKG: Interpreted by me.  Sinus tachycardia with a rate of 122 bpm, normal PR interval, normal QRS, normal  QT  ____________________________________________  ED COURSE:  As part of my medical decision making, I reviewed the following data within the electronic MEDICAL RECORD NUMBER History obtained from family if available, nursing notes, old chart and ekg, as well as notes from prior ED visits. Patient presented for right-sided chest pain, we will assess with labs and imaging as indicated at this time.   Procedures  Wayne Roy was evaluated in Emergency Department on 04/20/2020 for the symptoms described in the history of present illness. He was evaluated in the context of the global COVID-19 pandemic, which necessitated consideration that the patient might be at risk for infection with the SARS-CoV-2 virus that causes COVID-19. Institutional protocols and algorithms that pertain to the evaluation of patients at risk for COVID-19 are in a state of rapid change based on information released by regulatory bodies including the CDC and federal and state organizations. These policies and algorithms were followed during the patient's care in the ED.  ____________________________________________   LABS (pertinent positives/negatives)  Labs Reviewed  BASIC METABOLIC PANEL - Abnormal; Notable for the following components:      Result Value   Glucose, Bld 144 (*)    All other components within normal limits  CBC  TSH  T4, FREE  TROPONIN I (HIGH SENSITIVITY)  TROPONIN I (HIGH SENSITIVITY)    RADIOLOGY  Chest x-ray  IMPRESSION: Chronic changes.  No active disease. ____________________________________________   DIFFERENTIAL DIAGNOSIS   Musculoskeletal pain, GERD, anxiety, MI, PE, pneumothorax, viral illness  FINAL ASSESSMENT AND PLAN  Chest pain, tooth ache, prescription refill   Plan: The patient had presented for right-sided chest pain. Patient's labs were unremarkable. Patient's imaging did not reveal any acute process.  Patient is requesting help with his sinuses, teeth, metoprolol  refill and right periscapular back pain.  I will refill as many medications as is deemed necessary today.  [facility].   Ulice Dash, MD    Note: This note was generated in part or whole with voice recognition software. Voice recognition is usually quite accurate but there are transcription errors that can and very often do occur. I apologize for any typographical errors that were not detected and corrected.     Emily Filbert, MD 04/20/20 [phone]

## 2020-04-20 NOTE — ED Triage Notes (Signed)
Pt reports for the last several months he has had continuous dull pain to his right chest that radiates into his right shoulder. Pt denies SOB, nausea or other symptoms.

## 2020-04-27 ENCOUNTER — Ambulatory Visit: Payer: Self-pay | Admitting: Gerontology

## 2020-04-27 ENCOUNTER — Other Ambulatory Visit: Payer: Self-pay | Admitting: Gerontology

## 2020-04-27 ENCOUNTER — Encounter: Payer: Self-pay | Admitting: Gerontology

## 2020-04-27 ENCOUNTER — Other Ambulatory Visit: Payer: Self-pay

## 2020-04-27 VITALS — BP 120/79 | HR 81 | Temp 97.0°F | Ht 69.0 in | Wt 270.0 lb

## 2020-04-27 DIAGNOSIS — M542 Cervicalgia: Secondary | ICD-10-CM

## 2020-04-27 DIAGNOSIS — K029 Dental caries, unspecified: Secondary | ICD-10-CM | POA: Insufficient documentation

## 2020-04-27 DIAGNOSIS — Z0000 Encounter for general adult medical examination without abnormal findings: Secondary | ICD-10-CM | POA: Insufficient documentation

## 2020-04-27 DIAGNOSIS — I10 Essential (primary) hypertension: Secondary | ICD-10-CM

## 2020-04-27 DIAGNOSIS — Z8719 Personal history of other diseases of the digestive system: Secondary | ICD-10-CM

## 2020-04-27 DIAGNOSIS — L409 Psoriasis, unspecified: Secondary | ICD-10-CM

## 2020-04-27 MED ORDER — OMEPRAZOLE 20 MG PO CPDR: 20.0000 mg | DELAYED_RELEASE_CAPSULE | Freq: Every day | ORAL | 2 refills | Status: DC

## 2020-04-27 MED ORDER — TRIAMCINOLONE ACETONIDE 0.1 % EX CREA: 1.0000 "application " | TOPICAL_CREAM | Freq: Two times a day (BID) | CUTANEOUS | 0 refills | Status: DC

## 2020-04-27 MED ORDER — MELOXICAM 15 MG PO TABS: 15.0000 mg | ORAL_TABLET | Freq: Every day | ORAL | 0 refills | Status: DC | PRN

## 2020-04-27 NOTE — Progress Notes (Signed)
Established Patient Office Visit  Subjective:  Patient ID: Wayne Roy, male    DOB: 09-08-1956  Age: 64 y.o. MRN: [id number]  CC:  Chief Complaint  Patient presents with  . Hyperlipidemia  . Hypertension  . Recurrent Sinusitis    on PCN for tooth infection    HPI Wayne Roy presents for follow up after an ED visit. He has not been seen at the clinic because he was incarcerated. He was seen at the ED on 04/20/2020 for right sided chest pain and medication refill. Chest X ray was done and it shows chronic changes, and no active disease per Dr Ardeen Garland K. His EKG showed sinus tachycardia with rate of 122 bpm, normal PR interval, normal QRS and normal QT, his metoprolol 25 mg bid was refilled. Currently, he states that he has been experiencing tooth ache to his left lower mandibular and left upper mandibular teeth, due to decay, was treated with antibiotics and requests dental referral. He also states that he has been experiencing acid reflux and was taking Omeprazole while incarcerated, but out of medication. He also states that he experiences constant dull 6/10 pain to posterior aspect of his neck that radiates to bilateral shoulders.He states that pain intensity increases with walking and rotating his neck. He was prescribed Flexerill 10 mg tid during his recent ED visit and he reports experiencing minimal relief with taking medication. He states that he has a history of arthritis and neck pain has been going on for many years. He also c/o of intermittent peripheral neuropathy that wakes him up sometimes at night. He also c/o scattered non pruritic dry silvery white scaly lesions to his legs that started while he's incarcerated.Overall, he states that he's doing well and offers no further complaint.  Past Medical History:  Diagnosis Date  . Arthritis   . Bipolar 1 disorder ([place])   . Hypertension   . Hypothyroidism   . Myocardial infarction ([place])     No past surgical history on  file.  Family History  Problem Relation Age of Onset  . Arthritis Mother   . Bipolar disorder Mother   . Hypertension Sister     Social History   Socioeconomic History  . Marital status: Divorced    Spouse name: Not on file  . Number of children: Not on file  . Years of education: Not on file  . Highest education level: Not on file  Occupational History  . Not on file  Tobacco Use  . Smoking status: Current Every Day Smoker    Packs/day: 0.50    Years: 45.00    Pack years: 22.50  . Smokeless tobacco: Never Used  . Tobacco comment: 1 pack every 3 days  Substance and Sexual Activity  . Alcohol use: No  . Drug use: No  . Sexual activity: Not Currently  Other Topics Concern  . Not on file  Social History Narrative   In prison for 10 years   Social Determinants of Health   Financial Resource Strain:   . Difficulty of Paying Living Expenses:   Food Insecurity:   . Worried About Charity fundraiser in the Last Year:   . Arboriculturist in the Last Year:   Transportation Needs:   . Film/video editor (Medical):   Marland Kitchen Lack of Transportation (Non-Medical):   Physical Activity:   . Days of Exercise per Week:   . Minutes of Exercise per Session:   Stress:   .  Feeling of Stress :   Social Connections:   . Frequency of Communication with Friends and Family:   . Frequency of Social Gatherings with Friends and Family:   . Attends Religious Services:   . Active Member of Clubs or Organizations:   . Attends Banker Meetings:   Marland Kitchen Marital Status:   Intimate Partner Violence:   . Fear of Current or Ex-Partner:   . Emotionally Abused:   Marland Kitchen Physically Abused:   . Sexually Abused:     Outpatient Medications Prior to Visit  Medication Sig Dispense Refill  . cyclobenzaprine (FLEXERIL) 10 MG tablet Take 1 tablet (10 mg total) by mouth 3 (three) times daily as needed for muscle spasms. 30 tablet 0  . metoprolol tartrate (LOPRESSOR) 25 MG tablet Take 1 tablet (25  mg total) by mouth 2 (two) times daily. 60 tablet 11  . penicillin v potassium (VEETID) 250 MG tablet Take 1 tablet (250 mg total) by mouth 4 (four) times daily. 40 tablet 0  . cyclobenzaprine (FLEXERIL) 10 MG tablet TAKE ONE TABLET BY MOUTH 2 TIMES A DAY AS NEEDED FOR MUSCLE SPASMS 60 tablet 0  . omeprazole (PRILOSEC) 20 MG capsule Take 20 mg by mouth daily.    . ARIPiprazole (ABILIFY) 10 MG tablet Take 10 mg by mouth daily.    . busPIRone (BUSPAR) 15 MG tablet Take 30 mg by mouth 2 (two) times daily.    . mirtazapine (REMERON) 30 MG tablet Take 30 mg by mouth at bedtime. Reported on 02/24/2016    . venlafaxine XR (EFFEXOR-XR) 150 MG 24 hr capsule Take 300 mg by mouth daily with breakfast.    . amLODipine (NORVASC) 5 MG tablet Take 1 tablet (5 mg total) by mouth daily. (Patient not taking: Reported on 03/30/2018) 30 tablet 5  . fluticasone (FLONASE) 50 MCG/ACT nasal spray Place 1 spray into both nostrils daily. (Patient not taking: Reported on 04/27/2020) 1 g 0  . levothyroxine (SYNTHROID, LEVOTHROID) 25 MCG tablet TAKE 1 1/2 TABLETS (37.5MG ) BY MOUTH DAILY (Patient not taking: Reported on 03/30/2018) 45 tablet 0  . ranitidine (ZANTAC) 150 MG tablet TAKE 1 TABLET BY MOUTH 2 TIMES A DAY (Patient not taking: Reported on 03/30/2018) 180 tablet 0   No facility-administered medications prior to visit.    Allergies  Allergen Reactions  . Saphris [Asenapine] Other (See Comments)    Other reaction(s): Other (See Comments) Reaction:Sores in pts mouth, nose, and ears Reaction:  Sores in pts mouth, nose, and ears    ROS Review of Systems  Constitutional: Negative.   HENT: Positive for dental problem.   Eyes: Negative.   Respiratory: Negative.   Cardiovascular: Negative.   Gastrointestinal: Negative.   Endocrine: Negative.   Genitourinary: Negative.   Musculoskeletal: Positive for neck pain (Chronic neck pain).  Skin:       Dry scaly lesion to lower legs.  Neurological: Negative.    Hematological: Negative.   Psychiatric/Behavioral: Negative.       Objective:    Physical Exam  Constitutional: He appears well-developed.  HENT:  Head: Normocephalic and atraumatic.  Eyes: Pupils are equal, round, and reactive to light. EOM are normal.  Neck:    Cardiovascular: Normal rate and regular rhythm.  Pulmonary/Chest: Effort normal and breath sounds normal.  Abdominal: Soft. Bowel sounds are normal.  Musculoskeletal:     Cervical back: Normal range of motion.  Neurological: He is alert.  Skin:  Scattered dry silvery white scaly lesion to lower legs.  Psychiatric: He has a normal mood and affect. His behavior is normal. Judgment and thought content normal.    BP 120/79 (BP Location: Left Arm, Patient Position: Sitting)   Pulse 81   Temp (!) 97 F (36.1 C)   Ht 5\' 9"  (1.753 m)   Wt 270 lb (122.5 kg)   SpO2 96%   BMI 39.87 kg/m  Wt Readings from Last 3 Encounters:  04/27/20 270 lb (122.5 kg)  04/20/20 250 lb (113.4 kg)  03/29/18 207 lb (93.9 kg)    He was advised to continue on a weight loss regimen.  Health Maintenance Due  Topic Date Due  . Hepatitis C Screening  Never done  . HIV Screening  Never done  . COVID-19 Vaccine (1) Never done  . TETANUS/TDAP  Never done  . COLONOSCOPY  Never done    There are no preventive care reminders to display for this patient.  Lab Results  Component Value Date   TSH 4.060 04/20/2020   Lab Results  Component Value Date   WBC 9.4 04/20/2020   HGB 15.5 04/20/2020   HCT 43.7 04/20/2020   MCV 90.9 04/20/2020   PLT 242 04/20/2020   Lab Results  Component Value Date   NA 138 04/20/2020   K 4.1 04/20/2020   CO2 24 04/20/2020   GLUCOSE 144 (H) 04/20/2020   BUN 14 04/20/2020   CREATININE 1.11 04/20/2020   BILITOT 0.6 03/29/2018   ALKPHOS 55 03/29/2018   AST 40 03/29/2018   ALT 33 03/29/2018   PROT 7.6 03/29/2018   ALBUMIN 4.5 03/29/2018   CALCIUM 9.1 04/20/2020   ANIONGAP 8 04/20/2020   Lab Results   Component Value Date   CHOL 147 03/19/2018   Lab Results  Component Value Date   HDL 46 03/19/2018   Lab Results  Component Value Date   LDLCALC 81 03/19/2018   Lab Results  Component Value Date   TRIG 102 03/19/2018   Lab Results  Component Value Date   CHOLHDL 3.2 03/19/2018   Lab Results  Component Value Date   HGBA1C 5.3 10/10/2016      Assessment & Plan:    1. Essential hypertension - His blood pressure is under control and he will continue on current regimen, DASH diet and exercise as tolerated.  2. Neck pain, chronic - He will continue on Meloxicam, educated on medication side effects and to notify clinic. He will follow up with [facility]. - meloxicam (MOBIC) 15 MG tablet; Take 1 tablet (15 mg total) by mouth daily as needed for pain.  Dispense: 30 tablet; Refill: 0  3. Dental decay - He was provided with Dental application  4. History of gastroesophageal reflux (GERD) - He will continue on Omeprazole, advised to  --Avoid spicy, fatty and fried food -Avoid sodas and sour juices -Avoid heavy meals -Avoid eating 4 hours before bedtime -Elevate head of bed at night - omeprazole (PRILOSEC) 20 MG capsule; Take 1 capsule (20 mg total) by mouth daily.  Dispense: 30 capsule; Refill: 2  5. Psoriasis - He will continue on Triamcinolone, was educated on medication side effects and advised to notify clinic. - triamcinolone cream (KENALOG) 0.1 %; Apply 1 application topically 2 (two) times daily.  Dispense: 30 g; Refill: 0  6. Health care maintenance - Routine labs will be checked. - Lipid panel; Future - HgB A1c; Future - Urinalysis; Future    Follow-up: Return in about 22 days (around 05/19/2020), or if symptoms  worsen or fail to improve.    Ephram Kornegay Trellis Paganini, NP

## 2020-05-03 ENCOUNTER — Emergency Department
Admission: EM | Admit: 2020-05-03 | Discharge: 2020-05-03 | Disposition: A | Discharge status: Home / Self Care | Payer: Self-pay | Attending: Student | Admitting: Student

## 2020-05-03 ENCOUNTER — Emergency Department: Payer: Self-pay

## 2020-05-03 ENCOUNTER — Other Ambulatory Visit: Payer: Self-pay

## 2020-05-03 DIAGNOSIS — I252 Old myocardial infarction: Secondary | ICD-10-CM | POA: Insufficient documentation

## 2020-05-03 DIAGNOSIS — M62830 Muscle spasm of back: Secondary | ICD-10-CM | POA: Insufficient documentation

## 2020-05-03 DIAGNOSIS — Z79899 Other long term (current) drug therapy: Secondary | ICD-10-CM | POA: Insufficient documentation

## 2020-05-03 DIAGNOSIS — E039 Hypothyroidism, unspecified: Secondary | ICD-10-CM | POA: Insufficient documentation

## 2020-05-03 DIAGNOSIS — F17210 Nicotine dependence, cigarettes, uncomplicated: Secondary | ICD-10-CM | POA: Insufficient documentation

## 2020-05-03 DIAGNOSIS — I10 Essential (primary) hypertension: Secondary | ICD-10-CM | POA: Insufficient documentation

## 2020-05-03 DIAGNOSIS — M5489 Other dorsalgia: Secondary | ICD-10-CM | POA: Insufficient documentation

## 2020-05-03 DIAGNOSIS — R079 Chest pain, unspecified: Secondary | ICD-10-CM | POA: Insufficient documentation

## 2020-05-03 LAB — BASIC METABOLIC PANEL
Anion gap: 12 (ref 5–15)
BUN: 21 mg/dL (ref 8–23)
CO2: 21 mmol/L — ABNORMAL LOW (ref 22–32)
Calcium: 9.5 mg/dL (ref 8.9–10.3)
Chloride: 104 mmol/L (ref 98–111)
Creatinine, Ser: 1.08 mg/dL (ref 0.61–1.24)
GFR calc Af Amer: >60 mL/min (ref >60)
GFR calc non Af Amer: >60 mL/min (ref >60)
Glucose, Bld: 146 mg/dL — ABNORMAL HIGH (ref 70–99)
Potassium: 3.8 mmol/L (ref 3.5–5.1)
Sodium: 137 mmol/L (ref 135–145)

## 2020-05-03 LAB — HEPATIC FUNCTION PANEL
ALT: 45 U/L — ABNORMAL HIGH (ref 0–44)
AST: 33 U/L (ref 15–41)
Albumin: 4.2 g/dL (ref 3.5–5.0)
Alkaline Phosphatase: 73 U/L (ref 38–126)
Bilirubin, Direct: <0.1 mg/dL (ref 0.0–0.2)
Total Bilirubin: 0.7 mg/dL (ref 0.3–1.2)
Total Protein: 8 g/dL (ref 6.5–8.1)

## 2020-05-03 LAB — CBC
HCT: 46.9 % (ref 39.0–52.0)
Hemoglobin: 16.4 g/dL (ref 13.0–17.0)
MCH: 32 pg (ref 26.0–34.0)
MCHC: 35 g/dL (ref 30.0–36.0)
MCV: 91.6 fL (ref 80.0–100.0)
Platelets: 252 10*3/uL (ref 150–400)
RBC: 5.12 MIL/uL (ref 4.22–5.81)
RDW: 13 % (ref 11.5–15.5)
WBC: 8.8 10*3/uL (ref 4.0–10.5)
nRBC: 0 % (ref 0.0–0.2)

## 2020-05-03 LAB — TROPONIN I (HIGH SENSITIVITY)
Troponin I (High Sensitivity): 3 ng/L (ref <18)
Troponin I (High Sensitivity): 4 ng/L (ref <18)

## 2020-05-03 MED ORDER — METHOCARBAMOL 500 MG PO TABS
500.0000 mg | ORAL_TABLET | Freq: Once | ORAL | Status: AC
  Administered 2020-05-03: 500 mg via ORAL
  Filled 2020-05-03: qty 1

## 2020-05-03 MED ORDER — KETOROLAC TROMETHAMINE 15 MG/ML IJ SOLN
15.0000 mg | Freq: Once | INTRAMUSCULAR | Status: AC
  Administered 2020-05-03: 15 mg via INTRAVENOUS
  Filled 2020-05-03: qty 1

## 2020-05-03 MED ORDER — SODIUM CHLORIDE 0.9% FLUSH: 3.0000 mL | Freq: Once | INTRAVENOUS | Status: DC

## 2020-05-03 MED ORDER — METHOCARBAMOL 500 MG PO TABS: 500.0000 mg | ORAL_TABLET | Freq: Four times a day (QID) | ORAL | 0 refills | Status: AC | PRN

## 2020-05-03 MED ORDER — METOPROLOL TARTRATE 25 MG PO TABS
25.0000 mg | ORAL_TABLET | Freq: Once | ORAL | Status: AC
  Administered 2020-05-03: 25 mg via ORAL
  Filled 2020-05-03: qty 1

## 2020-05-03 MED ORDER — IOHEXOL 350 MG/ML SOLN
75.0000 mL | Freq: Once | INTRAVENOUS | Status: AC | PRN
  Administered 2020-05-03: 75 mL via INTRAVENOUS

## 2020-05-03 MED ORDER — IBUPROFEN 600 MG PO TABS
600.0000 mg | ORAL_TABLET | Freq: Four times a day (QID) | ORAL | 0 refills | Status: DC | PRN
Start: 2020-05-03 — End: 2020-05-20

## 2020-05-03 NOTE — Discharge Instructions (Addendum)
Thank you for letting us take care of you in the emergency department today.   Please continue to take any regular, prescribed medications. We have written you prescriptions for ibuprofen for pain/inflammation and methocarbamol (Robaxin) for muscle relaxant.   Please follow up with: Your primary care doctor to review your ER visit and follow up on your symptoms.    Please return to the ER for any new or worsening symptoms.

## 2020-05-03 NOTE — ED Triage Notes (Addendum)
Pt c/o right shoulder/back pain for the past couple of months and was seen here for the same, states he has not followed up with anyone for the continued pain.  Pt states it hurts to breath or even talk.

## 2020-05-03 NOTE — ED Provider Notes (Addendum)
[facility] Emergency Department Provider Note  ____________________________________________   First MD Initiated Contact with Patient 05/03/20 1315     (approximate)  I have reviewed the triage vital signs and the nursing notes.  History  Chief Complaint Back Pain    HPI Wayne Roy is a 64 y.o. male PMHx as below who presents to the ER for R sided upper/mid thoracic back pain.  Patient describes the pain as a sharp, stabbing.  No radiation.  No apparent alleviating/aggravating components, has not improved with meloxicam or Flexeril.  Symptoms first started about a month ago and were intermittent.  He initially attributed this to an awkward position when sleeping.  Symptoms seem to come and go without any identifiable trigger.  Over the last week or so the pain has been more constant.  Pain is worsened with certain movements and positional changes, as well as deep inspiration.  He denies any anterior pain.  No chest pain.  No shortness of breath at rest.  No leg swelling or history of VTE.  No fever or cough. No changes with eating. No vomiting.  He does note prior to the onset of his symptoms approximately 1 month ago he had a fall, and thinks he may have fallen onto that side. Did not think much of the fall immediately afterwards.   On arrival, he is notably tachycardic, but states he has not taken his a.m. metoprolol this morning because he was at another appointment.   Past Medical Hx Past Medical History:  Diagnosis Date  . Arthritis   . Bipolar 1 disorder (HCC)   . Hypertension   . Hypothyroidism   . Myocardial infarction [facility])     Problem List Patient Active Problem List   Diagnosis Date Noted  . Neck pain, chronic 04/27/2020  . Dental decay 04/27/2020  . History of gastroesophageal reflux (GERD) 04/27/2020  . Psoriasis 04/27/2020  . Health care maintenance 04/27/2020  . Bipolar affective disorder, current episode hypomanic (HCC) 04/01/2018   . Cannabis abuse 04/01/2018  . Noncompliance 04/01/2018  . Hyperlipidemia 03/19/2018  . Medication monitoring encounter 03/19/2018  . Tobacco use 03/19/2018  . Back pain 02/15/2017  . Bipolar disorder (HCC) 10/24/2016  . Essential hypertension 01/20/2016  . Hypothyroidism 01/20/2016  . Chest pain 01/20/2016    Past Surgical Hx History reviewed. No pertinent surgical history.  Medications Prior to Admission medications   Medication Sig Start Date End Date Taking? Authorizing Provider  ARIPiprazole (ABILIFY) 10 MG tablet Take 10 mg by mouth daily.    [provider]  busPIRone (BUSPAR) 15 MG tablet Take 30 mg by mouth 2 (two) times daily.    [provider]  cyclobenzaprine (FLEXERIL) 10 MG tablet Take 1 tablet (10 mg total) by mouth 3 (three) times daily as needed for muscle spasms. 04/20/20   Emily Filbert, MD  meloxicam (MOBIC) 15 MG tablet Take 1 tablet (15 mg total) by mouth daily as needed for pain. 04/27/20   Iloabachie, Chioma E, NP  metoprolol tartrate (LOPRESSOR) 25 MG tablet Take 1 tablet (25 mg total) by mouth 2 (two) times daily. 04/20/20 04/20/21  Emily Filbert, MD  mirtazapine (REMERON) 30 MG tablet Take 30 mg by mouth at bedtime. Reported on 02/24/2016    [provider]  omeprazole (PRILOSEC) 20 MG capsule Take 1 capsule (20 mg total) by mouth daily. 04/27/20   Iloabachie, Chioma E, NP  penicillin v potassium (VEETID) 250 MG tablet Take 1 tablet (250  mg total) by mouth 4 (four) times daily. 04/20/20   Earleen Newport, MD  triamcinolone cream (KENALOG) 0.1 % Apply 1 application topically 2 (two) times daily. 04/27/20   Iloabachie, Chioma E, NP  venlafaxine XR (EFFEXOR-XR) 150 MG 24 hr capsule Take 300 mg by mouth daily with breakfast.    [provider]    Allergies Saphris [asenapine]  Family Hx Family History  Problem Relation Age of Onset  . Arthritis Mother   . Bipolar disorder Mother   . Hypertension Sister      Social Hx Social History   Tobacco Use  . Smoking status: Current Every Day Smoker    Packs/day: 0.50    Years: 45.00    Pack years: 22.50  . Smokeless tobacco: Never Used  . Tobacco comment: 1 pack every 3 days  Substance Use Topics  . Alcohol use: No  . Drug use: No     Review of Systems  Constitutional: Negative for fever. Negative for chills. Eyes: Negative for visual changes. ENT: Negative for sore throat. Cardiovascular: Negative for chest pain. Respiratory: Negative for shortness of breath. Gastrointestinal: Negative for nausea. Negative for vomiting.  Genitourinary: Negative for dysuria. Musculoskeletal: Negative for leg swelling. + back pain Skin: Negative for rash. Neurological: Negative for headaches.   Physical Exam  Vital Signs: ED Triage Vitals  Enc Vitals Group     BP 05/03/20 1009 (!) 174/102     Pulse Rate 05/03/20 1009 (!) 120     Resp 05/03/20 1007 18     Temp 05/03/20 1013 97.9 F (36.6 C)     Temp Source 05/03/20 1007 Oral     SpO2 05/03/20 1007 98 %     Weight 05/03/20 1008 270 lb (122.5 kg)     Height 05/03/20 1008 5\' 9"  (1.753 m)     Head Circumference --      Peak Flow --      Pain Score 05/03/20 1008 8     Pain Loc --      Pain Edu? --      Excl. in [place]? --     Constitutional: Alert and oriented. Well appearing, resting comfortably in bed. NAD.  Head: Normocephalic. Atraumatic. Eyes: Conjunctivae clear. Sclera anicteric. Pupils equal and symmetric. Nose: No masses or lesions. No congestion or rhinorrhea. Mouth/Throat: Wearing mask.  Neck: No stridor. Trachea midline.  Cardiovascular: Tachycardic, regular rhythm. Extremities well perfused. Respiratory: Normal respiratory effort.  Lungs CTAB. Gastrointestinal: Soft. Non-distended. Non-tender.  Genitourinary: Deferred. Musculoskeletal: No lower extremity edema. No deformities. FROM R shoulder.  Back: Visual inspection reveals no evidence of acute trauma, no ecchymosis or  deformities.  No midline tenderness.  Mild mid thoracic right-sided paraspinal muscular tenderness with palpation. Neurologic:  Normal speech and language. No gross focal or lateralizing neurologic deficits are appreciated.  Skin: Skin is warm, dry and intact. No rash noted. Psychiatric: Mood and affect are appropriate for situation.  EKG  Personally reviewed and interpreted by myself.   Date: 05/03/20 Time: 1026 Rate: 119 Rhythm: sinus Axis: normal Intervals: WNL Sinus tachycardia  No STEMI    Radiology  Personally reviewed available imaging myself.   CXR - IMPRESSION:  Mild scarring right mid lung. Lungs otherwise clear. Cardiac  silhouette normal.   CT PE - IMPRESSION:  1. No CT findings for pulmonary embolism.  2. Normal thoracic aorta.  3. 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  4. No acute pulmonary findings or worrisome pulmonary lesions.  Procedures  Procedure(s) performed (including critical care):  Procedures   Initial Impression / Assessment and Plan / MDM / ED Course  64 y.o. male who presents to the ED for R sided mid back pain  Ddx: fall/trauma ~1 month ago causing rib fracture or contusion, muscle spasms/MSK, atypical ACS, atypical biliary colic, occult pulmonary infection.  Patient is noted to be tachycardic on arrival, though states he has not taken his AM metoprolol today.  However, in the setting of his symptoms and tachycardia, do consider PE.  Will plan for labs, imaging.  Will give his normal dose of his metoprolol.  Symptom control and reassess  CT negative for PE, no acute pulmonary findings.  EKG reveals sinus tachycardia, but no evidence of acute arrhythmia or ischemia.  Troponin and delta are both negative. HR improved s/p metoprolol and pain control. HFP unremarkable. As such, given negative work-up, feel patient is stable for discharge with outpatient follow-up and supportive care.  Given return  precautions.   _______________________________   As part of my medical decision making I have reviewed available labs, radiology tests, reviewed old records/performed chart review.    Final Clinical Impression(s) / ED Diagnosis  Final diagnoses:  Pain in right paraspinal region  Muscle spasm of back       Note:  This document was prepared using Dragon voice recognition software and may include unintentional dictation errors.     Miguel Aschoff., MD 05/03/20 [phone]

## 2020-05-12 ENCOUNTER — Other Ambulatory Visit: Payer: Self-pay

## 2020-05-12 ENCOUNTER — Other Ambulatory Visit: Payer: Medicaid Other

## 2020-05-12 DIAGNOSIS — Z0000 Encounter for general adult medical examination without abnormal findings: Secondary | ICD-10-CM

## 2020-05-13 ENCOUNTER — Other Ambulatory Visit: Payer: Self-pay | Admitting: Psychiatry

## 2020-05-13 LAB — LIPID PANEL
Chol/HDL Ratio: 6.1 ratio — ABNORMAL HIGH (ref 0.0–5.0)
Cholesterol, Total: 190 mg/dL (ref 100–199)
HDL: 31 mg/dL — ABNORMAL LOW (ref >39)
LDL Chol Calc (NIH): 93 mg/dL (ref 0–99)
Triglycerides: 400 mg/dL — ABNORMAL HIGH (ref 0–149)
VLDL Cholesterol Cal: 66 mg/dL — ABNORMAL HIGH (ref 5–40)

## 2020-05-13 LAB — URINALYSIS
Bilirubin, UA: NEGATIVE
Glucose, UA: NEGATIVE
Ketones, UA: NEGATIVE
Leukocytes,UA: NEGATIVE
Nitrite, UA: NEGATIVE
Protein,UA: NEGATIVE
RBC, UA: NEGATIVE
Specific Gravity, UA: 1.024 (ref 1.005–1.030)
Urobilinogen, Ur: 0.2 mg/dL (ref 0.2–1.0)
pH, UA: 5 (ref 5.0–7.5)

## 2020-05-13 LAB — HEMOGLOBIN A1C
Est. average glucose Bld gHb Est-mCnc: 114 mg/dL
Hgb A1c MFr Bld: 5.6 % (ref 4.8–5.6)

## 2020-05-18 ENCOUNTER — Other Ambulatory Visit: Payer: Self-pay

## 2020-05-18 ENCOUNTER — Ambulatory Visit: Payer: Medicaid Other | Admitting: Specialist

## 2020-05-18 DIAGNOSIS — M542 Cervicalgia: Secondary | ICD-10-CM

## 2020-05-18 NOTE — Progress Notes (Signed)
   Subjective:    Patient ID: Wayne Roy, male    DOB: 1956/10/29, 64 y.o.   MRN: [id number]  HPI 64 year old with chronic Hx of neck pain. He has never been imaged. Pain radiates to the scapulae but no radiculopathy. He's been on NSAID with no relief. He has been to the ED twice, he was given flexeril with no relief.   Review of Systems     Objective:   Physical Exam ROM WNL except decreased L lateral flexion. Spurling's to the left cause contralateral neck pain. DTRs 2+ triceps, otherwise not present. Sensory and MMT NL.   A chest CT shows multi-level T-spine spondylosis.        Assessment & Plan:  Neck pain, probable DJD. Will obtain x-ray and return on next visit.

## 2020-05-20 ENCOUNTER — Ambulatory Visit
Admission: RE | Admit: 2020-05-20 | Discharge: 2020-05-20 | Disposition: A | Discharge status: Home / Self Care | Payer: Medicaid Other | Source: Ambulatory Visit | Attending: Specialist | Admitting: Specialist

## 2020-05-20 ENCOUNTER — Encounter: Payer: Self-pay | Admitting: Gerontology

## 2020-05-20 ENCOUNTER — Ambulatory Visit: Payer: Medicaid Other | Admitting: Gerontology

## 2020-05-20 ENCOUNTER — Other Ambulatory Visit: Payer: Self-pay

## 2020-05-20 VITALS — BP 152/101 | HR 78 | Ht 69.0 in | Wt 249.0 lb

## 2020-05-20 DIAGNOSIS — M542 Cervicalgia: Secondary | ICD-10-CM

## 2020-05-20 DIAGNOSIS — G8929 Other chronic pain: Secondary | ICD-10-CM | POA: Insufficient documentation

## 2020-05-20 DIAGNOSIS — I10 Essential (primary) hypertension: Secondary | ICD-10-CM

## 2020-05-20 DIAGNOSIS — L409 Psoriasis, unspecified: Secondary | ICD-10-CM

## 2020-05-20 DIAGNOSIS — E782 Mixed hyperlipidemia: Secondary | ICD-10-CM

## 2020-05-20 DIAGNOSIS — Z720 Tobacco use: Secondary | ICD-10-CM

## 2020-05-20 DIAGNOSIS — Z0000 Encounter for general adult medical examination without abnormal findings: Secondary | ICD-10-CM

## 2020-05-20 MED ORDER — ROSUVASTATIN CALCIUM 5 MG PO TABS: 5.0000 mg | ORAL_TABLET | Freq: Every day | ORAL | 0 refills | Status: DC

## 2020-05-20 MED ORDER — MELOXICAM 7.5 MG PO TABS: 7.5000 mg | ORAL_TABLET | Freq: Two times a day (BID) | ORAL | 0 refills | Status: DC | PRN

## 2020-05-20 MED ORDER — BLOOD PRESSURE KIT: 1.0000 | PACK | Freq: Every day | 0 refills | Status: AC

## 2020-05-20 MED ORDER — TRIAMCINOLONE ACETONIDE 0.1 % EX CREA: 1.0000 "application " | TOPICAL_CREAM | Freq: Two times a day (BID) | CUTANEOUS | 2 refills | Status: DC

## 2020-05-20 MED ORDER — LOSARTAN POTASSIUM 25 MG PO TABS: 25.0000 mg | ORAL_TABLET | Freq: Every day | ORAL | 0 refills | Status: DC

## 2020-05-20 NOTE — Patient Instructions (Signed)
Smoking Tobacco Information, Adult Smoking tobacco can be harmful to your health. Tobacco contains a poisonous (toxic), colorless chemical called nicotine. Nicotine is addictive. It changes the brain and can make it hard to stop smoking. Tobacco also has other toxic chemicals that can hurt your body and raise your risk of many cancers. How can smoking tobacco affect me? Smoking tobacco puts you at risk for:  Cancer. Smoking is most commonly associated with lung cancer, but can also lead to cancer in other parts of the body.  Chronic obstructive pulmonary disease (COPD). This is a long-term lung condition that makes it hard to breathe. It also gets worse over time.  High blood pressure (hypertension), heart disease, stroke, or heart attack.  Lung infections, such as pneumonia.  Cataracts. This is when the lenses in the eyes become clouded.  Digestive problems. This may include peptic ulcers, heartburn, and gastroesophageal reflux disease (GERD).  Oral health problems, such as gum disease and tooth loss.  Loss of taste and smell. Smoking can affect your appearance by causing:  Wrinkles.  Yellow or stained teeth, fingers, and fingernails. Smoking tobacco can also affect your social life, because:  It may be challenging to find places to smoke when away from home. Many workplaces, restaurants, hotels, and public places are tobacco-free.  Smoking is expensive. This is due to the cost of tobacco and the long-term costs of treating health problems from smoking.  Secondhand smoke may affect those around you. Secondhand smoke can cause lung cancer, breathing problems, and heart disease. Children of smokers have a higher risk for: ? Sudden infant death syndrome (SIDS). ? Ear infections. ? Lung infections. If you currently smoke tobacco, quitting now can help you:  Lead a longer and healthier life.  Look, smell, breathe, and feel better over time.  Save money.  Protect others from the  harms of secondhand smoke. What actions can I take to prevent health problems? Quit smoking   Do not start smoking. Quit if you already do.  Make a plan to quit smoking and commit to it. Look for programs to help you and ask your health care provider for recommendations and ideas.  Set a date and write down all the reasons you want to quit.  Let your friends and family know you are quitting so they can help and support you. Consider finding friends who also want to quit. It can be easier to quit with someone else, so that you can support each other.  Talk with your health care provider about using nicotine replacement medicines to help you quit, such as gum, lozenges, patches, sprays, or pills.  Do not replace cigarette smoking with electronic cigarettes, which are commonly called e-cigarettes. The safety of e-cigarettes is not known, and some may contain harmful chemicals.  If you try to quit but return to smoking, stay positive. It is common to slip up when you first quit, so take it one day at a time.  Be prepared for cravings. When you feel the urge to smoke, chew gum or suck on hard candy. Lifestyle  Stay busy and take care of your body.  Drink enough fluid to keep your urine pale yellow.  Get plenty of exercise and eat a healthy diet. This can help prevent weight gain after quitting.  Monitor your eating habits. Quitting smoking can cause you to have a larger appetite than when you smoke.  Find ways to relax. Go out with friends or family to a movie or a restaurant   where people do not smoke.  Ask your health care provider about having regular tests (screenings) to check for cancer. This may include blood tests, imaging tests, and other tests.  Find ways to manage your stress, such as meditation, yoga, or exercise. Where to find support To get support to quit smoking, consider:  Asking your health care provider for more information and resources.  Taking classes to learn  more about quitting smoking.  Looking for local organizations that offer resources about quitting smoking.  Joining a support group for people who want to quit smoking in your local community.  Calling the smokefree.gov counselor helpline: 1-800-Quit-Now (1-800-784-8669) Where to find more information You may find more information about quitting smoking from:  HelpGuide.org: www.helpguide.org  Smokefree.gov: smokefree.gov  American Lung Association: www.lung.org Contact a health care provider if you:  Have problems breathing.  Notice that your lips, nose, or fingers turn blue.  Have chest pain.  Are coughing up blood.  Feel faint or you pass out.  Have other health changes that cause you to worry. Summary  Smoking tobacco can negatively affect your health, the health of those around you, your finances, and your social life.  Do not start smoking. Quit if you already do. If you need help quitting, ask your health care provider.  Think about joining a support group for people who want to quit smoking in your local community. There are many effective programs that will help you to quit this behavior. This information is not intended to replace advice given to you by your health care provider. Make sure you discuss any questions you have with your health care provider. Document Revised: 08/22/2019 Document Reviewed: 12/12/2016 Elsevier Patient Education  2020 [employer] Fat and Cholesterol Restricted Eating Plan Getting too much fat and cholesterol in your diet may cause health problems. Choosing the right foods helps keep your fat and cholesterol at normal levels. This can keep you from getting certain diseases. Your doctor may recommend an eating plan that includes:  Total fat: ______% or less of total calories a day.  Saturated fat: ______% or less of total calories a day.  Cholesterol: less than _________mg a day.  Fiber: ______g a day. What are tips for following  this plan? Meal planning  At meals, divide your plate into four equal parts: ? Fill one-half of your plate with vegetables and green salads. ? Fill one-fourth of your plate with whole grains. ? Fill one-fourth of your plate with low-fat (lean) protein foods.  Eat fish that is high in omega-3 fats at least two times a week. This includes mackerel, tuna, sardines, and salmon.  Eat foods that are high in fiber, such as whole grains, beans, apples, broccoli, carrots, peas, and barley. General tips   Work with your doctor to lose weight if you need to.  Avoid: ? Foods with added sugar. ? Fried foods. ? Foods with partially hydrogenated oils.  Limit alcohol intake to no more than 1 drink a day for nonpregnant women and 2 drinks a day for men. One drink equals 12 oz of beer, 5 oz of wine, or 1 oz of hard liquor. Reading food labels  Check food labels for: ? Trans fats. ? Partially hydrogenated oils. ? Saturated fat (g) in each serving. ? Cholesterol (mg) in each serving. ? Fiber (g) in each serving.  Choose foods with healthy fats, such as: ? Monounsaturated fats. ? Polyunsaturated fats. ? Omega-3 fats.  Choose grain products that have whole grains.   Look for the word "whole" as the first word in the ingredient list. Cooking  Cook foods using low-fat methods. These include baking, boiling, grilling, and broiling.  Eat more home-cooked foods. Eat at restaurants and buffets less often.  Avoid cooking using saturated fats, such as butter, cream, palm oil, palm kernel oil, and coconut oil. Recommended foods  Fruits  All fresh, canned (in natural juice), or frozen fruits. Vegetables  Fresh or frozen vegetables (raw, steamed, roasted, or grilled). Green salads. Grains  Whole grains, such as whole wheat or whole grain breads, crackers, cereals, and pasta. Unsweetened oatmeal, bulgur, barley, quinoa, or brown rice. Corn or whole wheat flour tortillas. Meats and other protein  foods  Ground beef (85% or leaner), grass-fed beef, or beef trimmed of fat. Skinless chicken or turkey. Ground chicken or turkey. Pork trimmed of fat. All fish and seafood. Egg whites. Dried beans, peas, or lentils. Unsalted nuts or seeds. Unsalted canned beans. Nut butters without added sugar or oil. Dairy  Low-fat or nonfat dairy products, such as skim or 1% milk, 2% or reduced-fat cheeses, low-fat and fat-free ricotta or cottage cheese, or plain low-fat and nonfat yogurt. Fats and oils  Tub margarine without trans fats. Light or reduced-fat mayonnaise and salad dressings. Avocado. Olive, canola, sesame, or safflower oils. The items listed above may not be a complete list of foods and beverages you can eat. Contact a dietitian for more information. Foods to avoid Fruits  Canned fruit in heavy syrup. Fruit in cream or butter sauce. Fried fruit. Vegetables  Vegetables cooked in cheese, cream, or butter sauce. Fried vegetables. Grains  White bread. White pasta. White rice. Cornbread. Bagels, pastries, and croissants. Crackers and snack foods that contain trans fat and hydrogenated oils. Meats and other protein foods  Fatty cuts of meat. Ribs, chicken wings, bacon, sausage, bologna, salami, chitterlings, fatback, hot dogs, bratwurst, and packaged lunch meats. Liver and organ meats. Whole eggs and egg yolks. Chicken and turkey with skin. Fried meat. Dairy  Whole or 2% milk, cream, half-and-half, and cream cheese. Whole milk cheeses. Whole-fat or sweetened yogurt. Full-fat cheeses. Nondairy creamers and whipped toppings. Processed cheese, cheese spreads, and cheese curds. Beverages  Alcohol. Sugar-sweetened drinks such as sodas, lemonade, and fruit drinks. Fats and oils  Butter, stick margarine, lard, shortening, ghee, or bacon fat. Coconut, palm kernel, and palm oils. Sweets and desserts  Corn syrup, sugars, honey, and molasses. Candy. Jam and jelly. Syrup. Sweetened cereals. Cookies,  pies, cakes, donuts, muffins, and ice cream. The items listed above may not be a complete list of foods and beverages you should avoid. Contact a dietitian for more information. Summary  Choosing the right foods helps keep your fat and cholesterol at normal levels. This can keep you from getting certain diseases.  At meals, fill one-half of your plate with vegetables and green salads.  Eat high-fiber foods, like whole grains, beans, apples, carrots, peas, and barley.  Limit added sugar, saturated fats, alcohol, and fried foods. This information is not intended to replace advice given to you by your health care provider. Make sure you discuss any questions you have with your health care provider. Document Revised: 07/31/2018 Document Reviewed: 08/14/2017 Elsevier Patient Education  2020 [employer] DASH Eating Plan DASH stands for "Dietary Approaches to Stop Hypertension." The DASH eating plan is a healthy eating plan that has been shown to reduce high blood pressure (hypertension). It may also reduce your risk for type 2 diabetes, heart disease, and stroke. The   DASH eating plan may also help with weight loss. What are tips for following this plan?  General guidelines  Avoid eating more than 2,300 mg (milligrams) of salt (sodium) a day. If you have hypertension, you may need to reduce your sodium intake to 1,500 mg a day.  Limit alcohol intake to no more than 1 drink a day for nonpregnant women and 2 drinks a day for men. One drink equals 12 oz of beer, 5 oz of wine, or 1 oz of hard liquor.  Work with your health care provider to maintain a healthy body weight or to lose weight. Ask what an ideal weight is for you.  Get at least 30 minutes of exercise that causes your heart to beat faster (aerobic exercise) most days of the week. Activities may include walking, swimming, or biking.  Work with your health care provider or diet and nutrition specialist (dietitian) to adjust your eating  plan to your individual calorie needs. Reading food labels   Check food labels for the amount of sodium per serving. Choose foods with less than 5 percent of the Daily Value of sodium. Generally, foods with less than 300 mg of sodium per serving fit into this eating plan.  To find whole grains, look for the word "whole" as the first word in the ingredient list. Shopping  Buy products labeled as "low-sodium" or "no salt added."  Buy fresh foods. Avoid canned foods and premade or frozen meals. Cooking  Avoid adding salt when cooking. Use salt-free seasonings or herbs instead of table salt or sea salt. Check with your health care provider or pharmacist before using salt substitutes.  Do not fry foods. Cook foods using healthy methods such as baking, boiling, grilling, and broiling instead.  Cook with heart-healthy oils, such as olive, canola, soybean, or sunflower oil. Meal planning  Eat a balanced diet that includes: ? 5 or more servings of fruits and vegetables each day. At each meal, try to fill half of your plate with fruits and vegetables. ? Up to 6-8 servings of whole grains each day. ? Less than 6 oz of lean meat, poultry, or fish each day. A 3-oz serving of meat is about the same size as a deck of cards. One egg equals 1 oz. ? 2 servings of low-fat dairy each day. ? A serving of nuts, seeds, or beans 5 times each week. ? Heart-healthy fats. Healthy fats called Omega-3 fatty acids are found in foods such as flaxseeds and coldwater fish, like sardines, salmon, and mackerel.  Limit how much you eat of the following: ? Canned or prepackaged foods. ? Food that is high in trans fat, such as fried foods. ? Food that is high in saturated fat, such as fatty meat. ? Sweets, desserts, sugary drinks, and other foods with added sugar. ? Full-fat dairy products.  Do not salt foods before eating.  Try to eat at least 2 vegetarian meals each week.  Eat more home-cooked food and less  restaurant, buffet, and fast food.  When eating at a restaurant, ask that your food be prepared with less salt or no salt, if possible. What foods are recommended? The items listed may not be a complete list. Talk with your dietitian about what dietary choices are best for you. Grains Whole-grain or whole-wheat bread. Whole-grain or whole-wheat pasta. Brown rice. Oatmeal. Quinoa. Bulgur. Whole-grain and low-sodium cereals. Pita bread. Low-fat, low-sodium crackers. Whole-wheat flour tortillas. Vegetables Fresh or frozen vegetables (raw, steamed, roasted, or grilled). Low-sodium   or reduced-sodium tomato and vegetable juice. Low-sodium or reduced-sodium tomato sauce and tomato paste. Low-sodium or reduced-sodium canned vegetables. Fruits All fresh, dried, or frozen fruit. Canned fruit in natural juice (without added sugar). Meat and other protein foods Skinless chicken or turkey. Ground chicken or turkey. Pork with fat trimmed off. Fish and seafood. Egg whites. Dried beans, peas, or lentils. Unsalted nuts, nut butters, and seeds. Unsalted canned beans. Lean cuts of beef with fat trimmed off. Low-sodium, lean deli meat. Dairy Low-fat (1%) or fat-free (skim) milk. Fat-free, low-fat, or reduced-fat cheeses. Nonfat, low-sodium ricotta or cottage cheese. Low-fat or nonfat yogurt. Low-fat, low-sodium cheese. Fats and oils Soft margarine without trans fats. Vegetable oil. Low-fat, reduced-fat, or light mayonnaise and salad dressings (reduced-sodium). Canola, safflower, olive, soybean, and sunflower oils. Avocado. Seasoning and other foods Herbs. Spices. Seasoning mixes without salt. Unsalted popcorn and pretzels. Fat-free sweets. What foods are not recommended? The items listed may not be a complete list. Talk with your dietitian about what dietary choices are best for you. Grains Baked goods made with fat, such as croissants, muffins, or some breads. Dry pasta or rice meal packs. Vegetables Creamed or  fried vegetables. Vegetables in a cheese sauce. Regular canned vegetables (not low-sodium or reduced-sodium). Regular canned tomato sauce and paste (not low-sodium or reduced-sodium). Regular tomato and vegetable juice (not low-sodium or reduced-sodium). Pickles. Olives. Fruits Canned fruit in a light or heavy syrup. Fried fruit. Fruit in cream or butter sauce. Meat and other protein foods Fatty cuts of meat. Ribs. Fried meat. Bacon. Sausage. Bologna and other processed lunch meats. Salami. Fatback. Hotdogs. Bratwurst. Salted nuts and seeds. Canned beans with added salt. Canned or smoked fish. Whole eggs or egg yolks. Chicken or turkey with skin. Dairy Whole or 2% milk, cream, and half-and-half. Whole or full-fat cream cheese. Whole-fat or sweetened yogurt. Full-fat cheese. Nondairy creamers. Whipped toppings. Processed cheese and cheese spreads. Fats and oils Butter. Stick margarine. Lard. Shortening. Ghee. Bacon fat. Tropical oils, such as coconut, palm kernel, or palm oil. Seasoning and other foods Salted popcorn and pretzels. Onion salt, garlic salt, seasoned salt, table salt, and sea salt. Worcestershire sauce. Tartar sauce. Barbecue sauce. Teriyaki sauce. Soy sauce, including reduced-sodium. Steak sauce. Canned and packaged gravies. Fish sauce. Oyster sauce. Cocktail sauce. Horseradish that you find on the shelf. Ketchup. Mustard. Meat flavorings and tenderizers. Bouillon cubes. Hot sauce and Tabasco sauce. Premade or packaged marinades. Premade or packaged taco seasonings. Relishes. Regular salad dressings. Where to find more information:  National Heart, Lung, and Blood Institute: www.nhlbi.nih.gov  American Heart Association: www.heart.org Summary  The DASH eating plan is a healthy eating plan that has been shown to reduce high blood pressure (hypertension). It may also reduce your risk for type 2 diabetes, heart disease, and stroke.  With the DASH eating plan, you should limit salt  (sodium) intake to 2,300 mg a day. If you have hypertension, you may need to reduce your sodium intake to 1,500 mg a day.  When on the DASH eating plan, aim to eat more fresh fruits and vegetables, whole grains, lean proteins, low-fat dairy, and heart-healthy fats.  Work with your health care provider or diet and nutrition specialist (dietitian) to adjust your eating plan to your individual calorie needs. This information is not intended to replace advice given to you by your health care provider. Make sure you discuss any questions you have with your health care provider. Document Revised: 11/09/2017 Document Reviewed: 11/20/2016 Elsevier Patient Education  2020 [employer]  

## 2020-05-20 NOTE — Progress Notes (Signed)
Established Patient Office Visit  Subjective:  Patient ID: Wayne Roy, male    DOB: 1956-01-18  Age: 64 y.o. MRN: [id number]  CC:  Chief Complaint  Patient presents with  . Hypertension    HPI Wayne Roy is 64 y.o male who presents to the clinic today for a follow up of hypertension, psoriasis, hyperlipidemia and neck pain. He was seen at the ED 05/03/2020 for R. Sided upper/mid  Thoracic back pain. He followed up with Dr. Vickki Hearing on 05/18/2020 for chronic neck pain and X-ray was recommedned. He continues to expereince intermittent pain to the neck and states that taking Meloxicam 47m minimally relives his symptoms. He states that he has been compliant with taking his medication as prescribed. He states that his acid reflux is under control with taking Omeprazole. His recent lab completed 05/12/2020 showed Triglycerides >4050mdL, HDL 31 mg/dL, VLDL cholesterol >66 mg/dL. Cholesterol/HDL ratio 6.1%. .Overall he states he is doing well and offers no additional complaint.   Past Medical History:  Diagnosis Date  . Arthritis   . Bipolar 1 disorder ([facility]  . Hypertension   . Hypothyroidism   . [place]ocardial infarction ([place]    No past surgical history on file.  Family History  Problem Relation Age of Onset  . Arthritis Mother   . Bipolar disorder Mother   . Hypertension Sister     Social History   Socioeconomic History  . Marital status: Divorced    Spouse name: Not on file  . Number of children: Not on file  . Years of education: Not on file  . Highest education level: Not on file  Occupational History  . Not on file  Tobacco Use  . Smoking status: Current Every Day Smoker    Packs/day: 0.50    Years: 45.00    Pack years: 22.50  . Smokeless tobacco: Never Used  . Tobacco comment: 1 pack every 3 days  Substance and Sexual Activity  . Alcohol use: No  . Drug use: No  . Sexual activity: Not Currently  Other Topics Concern  . Not on file  Social History Narrative    In prison for 10 years   Social Determinants of Health   Financial Resource Strain:   . Difficulty of Paying Living Expenses:   Food Insecurity:   . Worried About RuCharity fundraisern the Last Year:   . RaArboriculturistn the Last Year:   Transportation Needs:   . LaFilm/video editorMedical):   . Marland Kitchenack of Transportation (Non-Medical):   Physical Activity:   . Days of Exercise per Week:   . Minutes of Exercise per Session:   Stress:   . Feeling of Stress :   Social Connections:   . Frequency of Communication with Friends and Family:   . Frequency of Social Gatherings with Friends and Family:   . Attends Religious Services:   . Active Member of Clubs or Organizations:   . Attends ClArchivisteetings:   . Marland Kitchenarital Status:   Intimate Partner Violence:   . Fear of Current or Ex-Partner:   . Emotionally Abused:   . Marland Kitchenhysically Abused:   . Sexually Abused:     Outpatient Medications Prior to Visit  Medication Sig Dispense Refill  . cariprazine (VRAYLAR) capsule Take 1.5 mg by mouth daily.    . metoprolol tartrate (LOPRESSOR) 25 MG tablet Take 1 tablet (25 mg total) by mouth 2 (two) times daily. 60 tablet  11  . omeprazole (PRILOSEC) 20 MG capsule Take 1 capsule (20 mg total) by mouth daily. 30 capsule 2  . meloxicam (MOBIC) 15 MG tablet Take 1 tablet (15 mg total) by mouth daily as needed for pain. (Patient taking differently: Take 30 mg by mouth daily as needed for pain. ) 30 tablet 0  . triamcinolone cream (KENALOG) 0.1 % Apply 1 application topically 2 (two) times daily. 30 g 0  . ARIPiprazole (ABILIFY) 10 MG tablet Take 10 mg by mouth daily. (Patient not taking: Reported on 05/20/2020)    . busPIRone (BUSPAR) 15 MG tablet Take 30 mg by mouth 2 (two) times daily. (Patient not taking: Reported on 05/20/2020)    . cyclobenzaprine (FLEXERIL) 10 MG tablet Take 1 tablet (10 mg total) by mouth 3 (three) times daily as needed for muscle spasms. (Patient not taking: Reported  on 05/20/2020) 30 tablet 0  . ibuprofen (ADVIL) 600 MG tablet Take 1 tablet (600 mg total) by mouth every 6 (six) hours as needed for mild pain or moderate pain. (Patient not taking: Reported on 05/20/2020) 30 tablet 0  . mirtazapine (REMERON) 30 MG tablet Take 30 mg by mouth at bedtime. Reported on 02/24/2016    . penicillin v potassium (VEETID) 250 MG tablet Take 1 tablet (250 mg total) by mouth 4 (four) times daily. 40 tablet 0  . venlafaxine XR (EFFEXOR-XR) 150 MG 24 hr capsule Take 300 mg by mouth daily with breakfast.     No facility-administered medications prior to visit.    Allergies  Allergen Reactions  . Saphris [Asenapine] Other (See Comments)    Other reaction(s): Other (See Comments) Reaction:Sores in pts mouth, nose, and ears Reaction:  Sores in pts mouth, nose, and ears    ROS Review of Systems  Constitutional: Negative.   Respiratory: Negative.   Cardiovascular: Negative.   Musculoskeletal: Neck pain: .Being followed by Dr. Vickki Hearing. X-ray to be scheduled   Skin: Negative.   Neurological: Negative.   Psychiatric/Behavioral: Negative.       Objective:    Physical Exam HENT:     Head: Normocephalic and atraumatic.  Cardiovascular:     Rate and Rhythm: Normal rate and regular rhythm.     Pulses: Normal pulses.     Heart sounds: Normal heart sounds.  Pulmonary:     Effort: Pulmonary effort is normal.     Breath sounds: Normal breath sounds.  Neurological:     General: No focal deficit present.     Mental Status: He is alert and oriented to person, place, and time.  Psychiatric:        Mood and Affect: Mood normal.        Behavior: Behavior normal.        Thought Content: Thought content normal.        Judgment: Judgment normal.     BP (!) 152/101 (BP Location: Right Arm, Patient Position: Sitting)   Pulse 78   Ht 5' 9"  (1.753 m)   Wt 249 lb (112.9 kg)   SpO2 95%   BMI 36.77 kg/m  Wt Readings from Last 3 Encounters:  05/20/20 249 lb (112.9 kg)   05/12/20 248 lb 14.4 oz (112.9 kg)  05/03/20 270 lb (122.5 kg)     Health Maintenance Due  Topic Date Due  . Hepatitis C Screening  Never done  . COVID-19 Vaccine (1) Never done  . HIV Screening  Never done  . TETANUS/TDAP  Never done  . COLONOSCOPY  Never  done    There are no preventive care reminders to display for this patient.  Lab Results  Component Value Date   TSH 4.060 04/20/2020   Lab Results  Component Value Date   WBC 8.8 05/03/2020   HGB 16.4 05/03/2020   HCT 46.9 05/03/2020   MCV 91.6 05/03/2020   PLT 252 05/03/2020   Lab Results  Component Value Date   NA 137 05/03/2020   K 3.8 05/03/2020   CO2 21 (L) 05/03/2020   GLUCOSE 146 (H) 05/03/2020   BUN 21 05/03/2020   CREATININE 1.08 05/03/2020   BILITOT 0.7 05/03/2020   ALKPHOS 73 05/03/2020   AST 33 05/03/2020   ALT 45 (H) 05/03/2020   PROT 8.0 05/03/2020   ALBUMIN 4.2 05/03/2020   CALCIUM 9.5 05/03/2020   ANIONGAP 12 05/03/2020   Lab Results  Component Value Date   CHOL 190 05/12/2020   Lab Results  Component Value Date   HDL 31 (L) 05/12/2020   Lab Results  Component Value Date   LDLCALC 93 05/12/2020   Lab Results  Component Value Date   TRIG 400 (H) 05/12/2020   Lab Results  Component Value Date   CHOLHDL 6.1 (H) 05/12/2020   Lab Results  Component Value Date   HGBA1C 5.6 05/12/2020      Assessment & Plan:   1. Psoriasis Continue to use medication as precribed - triamcinolone cream (KENALOG) 0.1 %; Apply 1 application topically 2 (two) times daily.  Dispense: 30 g; Refill: 2  2. Essential hypertension His blood pressure is not under control. His goal should be <150/90. He is encouraged to monitor and record daily blood pressure readings before taking medication and 2 hours after smoking. - Start Losartan (COZAAR) 25 MG tablet; Take 1 tablet (25 mg total) by mouth daily.  Dispense: 30 tablet; Refill: 0 - Blood Pressure KIT; 1 kit by Does not apply route daily.  Dispense: 1  kit; Refill: 0 Pt was educated about adverse effects of the medication and was informed to notify provider of possible side effects.  3. Neck pain, chronic Complete X-ray and f/u with Dr. Vickki Hearing at the clinic. - meloxicam (MOBIC) 7.5 MG tablet; Take 1 tablet (7.5 mg total) by mouth 2 (two) times daily as needed for pain.  Dispense: 60 tablet; Refill: 0  4. Tobacco use He was encouraged to smoking cessation  5. Health care maintenance - Ambulatory referral to Gastroenterology for colonoscopy screening  6. Mixed hyperlipidemia His 10 year ASCVD risk was 28.4%. Pt was educated an encouraged to decrease intake of fatty foods, and foods high in cholesterol. - Start rosuvastatin (CRESTOR) 5 MG tablet; Take 1 tablet (5 mg total) by mouth daily.  Dispense: 30 tablet; Refill: 0. Pt was educated about adverse effects of the medication and was informed to notify provider of possible side effects.       Follow-up: 0707/2021 or if symptoms worsen or fail to improve.    Carney Corners, RN

## 2020-06-01 ENCOUNTER — Encounter: Payer: Self-pay | Admitting: *Deleted

## 2020-06-08 ENCOUNTER — Other Ambulatory Visit: Payer: Self-pay

## 2020-06-08 ENCOUNTER — Ambulatory Visit: Payer: Medicaid Other | Admitting: Specialist

## 2020-06-08 DIAGNOSIS — M542 Cervicalgia: Secondary | ICD-10-CM

## 2020-06-08 NOTE — Progress Notes (Signed)
° °  Subjective:    Patient ID: Wayne Roy, male    DOB: Dec 07, 1956, 64 y.o.   MRN: [id number]  HPI X rays show mild DDD, he's not tried a course of PT, I will order today and he will return to clinic upon completion of PT.    Review of Systems     Objective:   Physical Exam        Assessment & Plan:

## 2020-06-16 ENCOUNTER — Ambulatory Visit: Payer: Medicaid Other | Admitting: Gerontology

## 2020-06-16 ENCOUNTER — Encounter: Payer: Self-pay | Admitting: Gerontology

## 2020-06-16 ENCOUNTER — Other Ambulatory Visit: Payer: Self-pay

## 2020-06-16 VITALS — BP 117/77 | HR 67 | Ht 69.0 in | Wt 246.0 lb

## 2020-06-16 DIAGNOSIS — I10 Essential (primary) hypertension: Secondary | ICD-10-CM

## 2020-06-16 DIAGNOSIS — H539 Unspecified visual disturbance: Secondary | ICD-10-CM

## 2020-06-16 DIAGNOSIS — K5900 Constipation, unspecified: Secondary | ICD-10-CM

## 2020-06-16 DIAGNOSIS — E782 Mixed hyperlipidemia: Secondary | ICD-10-CM

## 2020-06-16 MED ORDER — ROSUVASTATIN CALCIUM 5 MG PO TABS: 5.0000 mg | ORAL_TABLET | Freq: Every day | ORAL | 3 refills | Status: DC

## 2020-06-16 MED ORDER — METOPROLOL TARTRATE 50 MG PO TABS: 50.0000 mg | ORAL_TABLET | Freq: Two times a day (BID) | ORAL | 3 refills | Status: DC

## 2020-06-16 NOTE — Patient Instructions (Signed)
Constipation, Adult Constipation is when a person:  Poops (has a bowel movement) fewer times in a week than normal.  Has a hard time pooping.  Has poop that is dry, hard, or bigger than normal. Follow these instructions at home: Eating and drinking   Eat foods that have a lot of fiber, such as: ? Fresh fruits and vegetables. ? Whole grains. ? Beans.  Eat less of foods that are high in fat, low in fiber, or overly processed, such as: ? [place] fries. ? Hamburgers. ? Cookies. ? Candy. ? Soda.  Drink enough fluid to keep your pee (urine) clear or pale yellow. General instructions  Exercise regularly or as told by your doctor.  Go to the restroom when you feel like you need to poop. Do not hold it in.  Take over-the-counter and prescription medicines only as told by your doctor. These include any fiber supplements.  Do pelvic floor retraining exercises, such as: ? Doing deep breathing while relaxing your lower belly (abdomen). ? Relaxing your pelvic floor while pooping.  Watch your condition for any changes.  Keep all follow-up visits as told by your doctor. This is important. Contact a doctor if:  You have pain that gets worse.  You have a fever.  You have not pooped for 4 days.  You throw up (vomit).  You are not hungry.  You lose weight.  You are bleeding from the anus.  You have thin, pencil-like poop (stool). Get help right away if:  You have a fever, and your symptoms suddenly get worse.  You leak poop or have blood in your poop.  Your belly feels hard or bigger than normal (is bloated).  You have very bad belly pain.  You feel dizzy or you faint. This information is not intended to replace advice given to you by your health care provider. Make sure you discuss any questions you have with your health care provider. Document Revised: 11/09/2017 Document Reviewed: 05/17/2016 Elsevier Patient Education  2020 Else[employer] Inc[employer] Fat and Cholesterol  Restricted Eating Plan Getting too much fat and cholesterol in your diet may cause health problems. Choosing the right foods helps keep your fat and cholesterol at normal levels. This can keep you from getting certain diseases. Your doctor may recommend an eating plan that includes:  Total fat: ______% or less of total calories a day.  Saturated fat: ______% or less of total calories a day.  Cholesterol: less than _________mg a day.  Fiber: ______g a day. What are tips for following this plan? Meal planning  At meals, divide your plate into four equal parts: ? Fill one-half of your plate with vegetables and green salads. ? Fill one-fourth of your plate with whole grains. ? Fill one-fourth of your plate with low-fat (lean) protein foods.  Eat fish that is high in omega-3 fats at least two times a week. This includes mackerel, tuna, sardines, and salmon.  Eat foods that are high in fiber, such as whole grains, beans, apples, broccoli, carrots, peas, and barley. General tips   Work with your doctor to lose weight if you need to.  Avoid: ? Foods with added sugar. ? Fried foods. ? Foods with partially hydrogenated oils.  Limit alcohol intake to no more than 1 drink a day for nonpregnant women and 2 drinks a day for men. One drink equals 12 oz of beer, 5 oz of wine, or 1 oz of hard liquor. Reading food labels  Check food labels for: ? Trans  fats. ? Partially hydrogenated oils. ? Saturated fat (g) in each serving. ? Cholesterol (mg) in each serving. ? Fiber (g) in each serving.  Choose foods with healthy fats, such as: ? Monounsaturated fats. ? Polyunsaturated fats. ? Omega-3 fats.  Choose grain products that have whole grains. Look for the word "whole" as the first word in the ingredient list. Cooking  Cook foods using low-fat methods. These include baking, boiling, grilling, and broiling.  Eat more home-cooked foods. Eat at restaurants and buffets less often.  Avoid  cooking using saturated fats, such as butter, cream, palm oil, palm kernel oil, and coconut oil. Recommended foods  Fruits  All fresh, canned (in natural juice), or frozen fruits. Vegetables  Fresh or frozen vegetables (raw, steamed, roasted, or grilled). Green salads. Grains  Whole grains, such as whole wheat or whole grain breads, crackers, cereals, and pasta. Unsweetened oatmeal, bulgur, barley, quinoa, or brown rice. Corn or whole wheat flour tortillas. Meats and other protein foods  Ground beef (85% or leaner), grass-fed beef, or beef trimmed of fat. Skinless chicken or [place]. Ground chicken or [place]. Pork trimmed of fat. All fish and seafood. Egg whites. Dried beans, peas, or lentils. Unsalted nuts or seeds. Unsalted canned beans. Nut butters without added sugar or oil. Dairy  Low-fat or nonfat dairy products, such as skim or 1% milk, 2% or reduced-fat cheeses, low-fat and fat-free ricotta or cottage cheese, or plain low-fat and nonfat yogurt. Fats and oils  Tub margarine without trans fats. Light or reduced-fat mayonnaise and salad dressings. Avocado. Olive, canola, sesame, or safflower oils. The items listed above may not be a complete list of foods and beverages you can eat. Contact a dietitian for more information. Foods to avoid Fruits  Canned fruit in heavy syrup. Fruit in cream or butter sauce. Fried fruit. Vegetables  Vegetables cooked in cheese, cream, or butter sauce. Fried vegetables. Grains  White bread. White pasta. White rice. Cornbread. Bagels, pastries, and croissants. Crackers and snack foods that contain trans fat and hydrogenated oils. Meats and other protein foods  Fatty cuts of meat. Ribs, chicken wings, bacon, sausage, bologna, salami, chitterlings, fatback, hot dogs, bratwurst, and packaged lunch meats. Liver and organ meats. Whole eggs and egg yolks. Chicken and [place] with skin. Fried meat. Dairy  Whole or 2% milk, cream, half-and-half, and cream  cheese. Whole milk cheeses. Whole-fat or sweetened yogurt. Full-fat cheeses. Nondairy creamers and whipped toppings. Processed cheese, cheese spreads, and cheese curds. Beverages  Alcohol. Sugar-sweetened drinks such as sodas, lemonade, and fruit drinks. Fats and oils  Butter, stick margarine, lard, shortening, ghee, or bacon fat. Coconut, palm kernel, and palm oils. Sweets and desserts  Corn syrup, sugars, honey, and molasses. Candy. Jam and jelly. Syrup. Sweetened cereals. Cookies, pies, cakes, donuts, muffins, and ice cream. The items listed above may not be a complete list of foods and beverages you should avoid. Contact a dietitian for more information. Summary  Choosing the right foods helps keep your fat and cholesterol at normal levels. This can keep you from getting certain diseases.  At meals, fill one-half of your plate with vegetables and green salads.  Eat high-fiber foods, like whole grains, beans, apples, carrots, peas, and barley.  Limit added sugar, saturated fats, alcohol, and fried foods. This information is not intended to replace advice given to you by your health care provider. Make sure you discuss any questions you have with your health care provider. Document Revised: 07/31/2018 Document Reviewed: 08/14/2017 Elsevier Patient Education  2020 Elsevier Inc. DASH Eating Plan DASH stands for "Dietary Approaches to Stop Hypertension." The DASH eating plan is a healthy eating plan that has been shown to reduce high blood pressure (hypertension). It may also reduce your risk for type 2 diabetes, heart disease, and stroke. The DASH eating plan may also help with weight loss. What are tips for following this plan?  General guidelines  Avoid eating more than 2,300 mg (milligrams) of salt (sodium) a day. If you have hypertension, you may need to reduce your sodium intake to 1,500 mg a day.  Limit alcohol intake to no more than 1 drink a day for nonpregnant women and 2  drinks a day for men. One drink equals 12 oz of beer, 5 oz of wine, or 1 oz of hard liquor.  Work with your health care provider to maintain a healthy body weight or to lose weight. Ask what an ideal weight is for you.  Get at least 30 minutes of exercise that causes your heart to beat faster (aerobic exercise) most days of the week. Activities may include walking, swimming, or biking.  Work with your health care provider or diet and nutrition specialist (dietitian) to adjust your eating plan to your individual calorie needs. Reading food labels   Check food labels for the amount of sodium per serving. Choose foods with less than 5 percent of the Daily Value of sodium. Generally, foods with less than 300 mg of sodium per serving fit into this eating plan.  To find whole grains, look for the word "whole" as the first word in the ingredient list. Shopping  Buy products labeled as "low-sodium" or "no salt added."  Buy fresh foods. Avoid canned foods and premade or frozen meals. Cooking  Avoid adding salt when cooking. Use salt-free seasonings or herbs instead of table salt or sea salt. Check with your health care provider or pharmacist before using salt substitutes.  Do not fry foods. Cook foods using healthy methods such as baking, boiling, grilling, and broiling instead.  Cook with heart-healthy oils, such as olive, canola, soybean, or sunflower oil. Meal planning  Eat a balanced diet that includes: ? 5 or more servings of fruits and vegetables each day. At each meal, try to fill half of your plate with fruits and vegetables. ? Up to 6-8 servings of whole grains each day. ? Less than 6 oz of lean meat, poultry, or fish each day. A 3-oz serving of meat is about the same size as a deck of cards. One egg equals 1 oz. ? 2 servings of low-fat dairy each day. ? A serving of nuts, seeds, or beans 5 times each week. ? Heart-healthy fats. Healthy fats called Omega-3 fatty acids are found in  foods such as flaxseeds and coldwater fish, like sardines, salmon, and mackerel.  Limit how much you eat of the following: ? Canned or prepackaged foods. ? Food that is high in trans fat, such as fried foods. ? Food that is high in saturated fat, such as fatty meat. ? Sweets, desserts, sugary drinks, and other foods with added sugar. ? Full-fat dairy products.  Do not salt foods before eating.  Try to eat at least 2 vegetarian meals each week.  Eat more home-cooked food and less restaurant, buffet, and fast food.  When eating at a restaurant, ask that your food be prepared with less salt or no salt, if possible. What foods are recommended? The items listed may not be a complete list. Talk  with your dietitian about what dietary choices are best for you. Grains Whole-grain or whole-wheat bread. Whole-grain or whole-wheat pasta. Brown rice. Orpah Cobb. Bulgur. Whole-grain and low-sodium cereals. Pita bread. Low-fat, low-sodium crackers. Whole-wheat flour tortillas. Vegetables Fresh or frozen vegetables (raw, steamed, roasted, or grilled). Low-sodium or reduced-sodium tomato and vegetable juice. Low-sodium or reduced-sodium tomato sauce and tomato paste. Low-sodium or reduced-sodium canned vegetables. Fruits All fresh, dried, or frozen fruit. Canned fruit in natural juice (without added sugar). Meat and other protein foods Skinless chicken or [place]. Ground chicken or [place]. Pork with fat trimmed off. Fish and seafood. Egg whites. Dried beans, peas, or lentils. Unsalted nuts, nut butters, and seeds. Unsalted canned beans. Lean cuts of beef with fat trimmed off. Low-sodium, lean deli meat. Dairy Low-fat (1%) or fat-free (skim) milk. Fat-free, low-fat, or reduced-fat cheeses. Nonfat, low-sodium ricotta or cottage cheese. Low-fat or nonfat yogurt. Low-fat, low-sodium cheese. Fats and oils Soft margarine without trans fats. Vegetable oil. Low-fat, reduced-fat, or light mayonnaise and salad  dressings (reduced-sodium). Canola, safflower, olive, soybean, and sunflower oils. Avocado. Seasoning and other foods Herbs. Spices. Seasoning mixes without salt. Unsalted popcorn and pretzels. Fat-free sweets. What foods are not recommended? The items listed may not be a complete list. Talk with your dietitian about what dietary choices are best for you. Grains Baked goods made with fat, such as croissants, muffins, or some breads. Dry pasta or rice meal packs. Vegetables Creamed or fried vegetables. Vegetables in a cheese sauce. Regular canned vegetables (not low-sodium or reduced-sodium). Regular canned tomato sauce and paste (not low-sodium or reduced-sodium). Regular tomato and vegetable juice (not low-sodium or reduced-sodium). Rosita Fire. Olives. Fruits Canned fruit in a light or heavy syrup. Fried fruit. Fruit in cream or butter sauce. Meat and other protein foods Fatty cuts of meat. Ribs. Fried meat. Tomasa Blase. Sausage. Bologna and other processed lunch meats. Salami. Fatback. Hotdogs. Bratwurst. Salted nuts and seeds. Canned beans with added salt. Canned or smoked fish. Whole eggs or egg yolks. Chicken or [place] with skin. Dairy Whole or 2% milk, cream, and half-and-half. Whole or full-fat cream cheese. Whole-fat or sweetened yogurt. Full-fat cheese. Nondairy creamers. Whipped toppings. Processed cheese and cheese spreads. Fats and oils Butter. Stick margarine. Lard. Shortening. Ghee. Bacon fat. Tropical oils, such as coconut, palm kernel, or palm oil. Seasoning and other foods Salted popcorn and pretzels. Onion salt, garlic salt, seasoned salt, table salt, and sea salt. Worcestershire sauce. Tartar sauce. Barbecue sauce. Teriyaki sauce. Soy sauce, including reduced-sodium. Steak sauce. Canned and packaged gravies. Fish sauce. Oyster sauce. Cocktail sauce. Horseradish that you find on the shelf. Ketchup. Mustard. Meat flavorings and tenderizers. Bouillon cubes. Hot sauce and Tabasco sauce.  Premade or packaged marinades. Premade or packaged taco seasonings. Relishes. Regular salad dressings. Where to find more information:  National Heart, Lung, and Blood Institute: PopSteam.is  American Heart Association: www.heart.org Summary  The DASH eating plan is a healthy eating plan that has been shown to reduce high blood pressure (hypertension). It may also reduce your risk for type 2 diabetes, heart disease, and stroke.  With the DASH eating plan, you should limit salt (sodium) intake to 2,300 mg a day. If you have hypertension, you may need to reduce your sodium intake to 1,500 mg a day.  When on the DASH eating plan, aim to eat more fresh fruits and vegetables, whole grains, lean proteins, low-fat dairy, and heart-healthy fats.  Work with your health care provider or diet and nutrition specialist (dietitian) to adjust your  eating plan to your individual calorie needs. This information is not intended to replace advice given to you by your health care provider. Make sure you discuss any questions you have with your health care provider. Document Revised: 11/09/2017 Document Reviewed: 11/20/2016 Elsevier Patient Education  2020 [employer].

## 2020-06-16 NOTE — Progress Notes (Addendum)
I cosigned Note  Established Patient Office Visit  Subjective:  Patient ID: Wayne Roy, male    DOB: 1956/04/06  Age: 64 y.o. MRN: [id number]  CC:  Chief Complaint  Patient presents with  . Hypertension    HPI Wayne Roy presents to follow up for hypertension. He was started on losartan and rosuvastatin at his last visit on 6/29. He reports taking an old rx of metoprolol 72m BID and not taking losartan because it made him feel weak. He is checking his blood pressure at home, averages 120s / 70s. He is eating less fried foods, less cheese and more vegetables and fruits. He is still smoking. Not wanting to think about quitting at this time. He does experience some occasional constipation which is relieved by OTC laxatives as needed. He reports some increased blurry vision, he reports being told he had cataracts years ago. He has a phone number for an ophthalmologist but is waiting to schedule.   Care gaps were discussed including colonoscopy, a referral was already made for him. He is waiting to schedule. Willing to do Hep C, HIV, and Tetanus vaccine, will provide him with a flyer. Overall, he states he's doing well and offers no further complaints.    Past Medical History:  Diagnosis Date  . Arthritis   . Bipolar 1 disorder ([place]   . Hypertension   . Hypothyroidism   . Myocardial infarction ([place]     No past surgical history on file.  Family History  Problem Relation Age of Onset  . Arthritis Mother   . Bipolar disorder Mother   . Hypertension Sister     Social History   Socioeconomic History  . Marital status: Divorced    Spouse name: Not on file  . Number of children: Not on file  . Years of education: Not on file  . Highest education level: Not on file  Occupational History  . Not on file  Tobacco Use  . Smoking status: Current Every Day Smoker    Packs/day: 0.50    Years: 45.00    Pack years: 22.50  . Smokeless tobacco: Never Used  . Tobacco comment: 1  pack every 3 days  Vaping Use  . Vaping Use: Never used  Substance and Sexual Activity  . Alcohol use: No  . Drug use: No  . Sexual activity: Not Currently  Other Topics Concern  . Not on file  Social History Narrative   In prison for 10 years   Social Determinants of Health   Financial Resource Strain: High Risk  . Difficulty of Paying Living Expenses: Very hard  Food Insecurity: No Food Insecurity  . Worried About RCharity fundraiserin the Last Year: Never true  . Ran Out of Food in the Last Year: Never true  Transportation Needs: Unmet Transportation Needs  . Lack of Transportation (Medical): No  . Lack of Transportation (Non-Medical): Yes  Physical Activity: Inactive  . Days of Exercise per Week: 0 days  . Minutes of Exercise per Session: 0 min  Stress: Stress Concern Present  . Feeling of Stress : Rather much  Social Connections: Socially Isolated  . Frequency of Communication with Friends and Family: Once a week  . Frequency of Social Gatherings with Friends and Family: Once a week  . Attends Religious Services: Never  . Active Member of Clubs or Organizations: No  . Attends CArchivistMeetings: Never  . Marital Status: Divorced  IHuman resources officerViolence: Not At  Risk  . Fear of Current or Ex-Partner: No  . Emotionally Abused: No  . Physically Abused: No  . Sexually Abused: No    Outpatient Medications Prior to Visit  Medication Sig Dispense Refill  . meloxicam (MOBIC) 7.5 MG tablet Take 1 tablet (7.5 mg total) by mouth 2 (two) times daily as needed for pain. 60 tablet 0  . omeprazole (PRILOSEC) 20 MG capsule Take 1 capsule (20 mg total) by mouth daily. 30 capsule 2  . losartan (COZAAR) 25 MG tablet Take 1 tablet (25 mg total) by mouth daily. 30 tablet 0  . metoprolol tartrate (LOPRESSOR) 25 MG tablet Take 1 tablet (25 mg total) by mouth 2 (two) times daily. (Patient taking differently: Take 50 mg by mouth 2 (two) times daily. ) 60 tablet 11  .  rosuvastatin (CRESTOR) 5 MG tablet Take 1 tablet (5 mg total) by mouth daily. 30 tablet 0  . Blood Pressure KIT 1 kit by Does not apply route daily. 1 kit 0  . cariprazine (VRAYLAR) capsule Take 1.5 mg by mouth daily. (Patient not taking: Reported on 06/16/2020)    . triamcinolone cream (KENALOG) 0.1 % Apply 1 application topically 2 (two) times daily. 30 g 2   No facility-administered medications prior to visit.    Allergies  Allergen Reactions  . Saphris [Asenapine] Other (See Comments)    Other reaction(s): Other (See Comments) Reaction:Sores in pts mouth, nose, and ears Reaction:  Sores in pts mouth, nose, and ears    ROS Review of Systems  Constitutional: Negative for appetite change, chills and fever.  HENT: Negative for sinus pressure and sore throat.   Eyes: Negative.   Respiratory: Negative for cough and chest tightness.   Gastrointestinal: Positive for constipation. Negative for abdominal pain.  Genitourinary: Negative for difficulty urinating and dysuria.  Neurological: Negative for headaches.      Objective:    Physical Exam Constitutional:      General: He is not in acute distress. HENT:     Head: Normocephalic and atraumatic.     Mouth/Throat:     Mouth: Mucous membranes are moist.     Pharynx: Oropharynx is clear.  Eyes:     General: No scleral icterus.    Extraocular Movements: Extraocular movements intact.     Pupils: Pupils are equal, round, and reactive to light.  Cardiovascular:     Rate and Rhythm: Normal rate and regular rhythm.     Heart sounds: Normal heart sounds.  Pulmonary:     Effort: Pulmonary effort is normal.     Breath sounds: Normal breath sounds.  Abdominal:     General: Bowel sounds are normal. There is no distension.     Palpations: Abdomen is soft.     Tenderness: There is no abdominal tenderness.  Musculoskeletal:     Cervical back: Normal range of motion.  Skin:    General: Skin is warm and dry.  Neurological:      General: No focal deficit present.     Mental Status: He is alert and oriented to person, place, and time.     BP 117/77 (BP Location: Left Arm, Patient Position: Sitting)   Pulse 67   Ht 5' 9"  (1.753 m)   Wt 246 lb (111.6 kg)   SpO2 98%   BMI 36.33 kg/m  Wt Readings from Last 3 Encounters:  06/16/20 246 lb (111.6 kg)  05/20/20 249 lb (112.9 kg)  05/12/20 248 lb 14.4 oz (112.9 kg)   Encouraged  weight loss.  Health Maintenance Due  Topic Date Due  . Hepatitis C Screening  Never done  . HIV Screening  Never done  . TETANUS/TDAP  Never done  . COLONOSCOPY  Never done    There are no preventive care reminders to display for this patient.  Lab Results  Component Value Date   TSH 4.060 04/20/2020   Lab Results  Component Value Date   WBC 8.8 05/03/2020   HGB 16.4 05/03/2020   HCT 46.9 05/03/2020   MCV 91.6 05/03/2020   PLT 252 05/03/2020   Lab Results  Component Value Date   NA 137 05/03/2020   K 3.8 05/03/2020   CO2 21 (L) 05/03/2020   GLUCOSE 146 (H) 05/03/2020   BUN 21 05/03/2020   CREATININE 1.08 05/03/2020   BILITOT 0.7 05/03/2020   ALKPHOS 73 05/03/2020   AST 33 05/03/2020   ALT 45 (H) 05/03/2020   PROT 8.0 05/03/2020   ALBUMIN 4.2 05/03/2020   CALCIUM 9.5 05/03/2020   ANIONGAP 12 05/03/2020   Lab Results  Component Value Date   CHOL 190 05/12/2020   Lab Results  Component Value Date   HDL 31 (L) 05/12/2020   Lab Results  Component Value Date   LDLCALC 93 05/12/2020   Lab Results  Component Value Date   TRIG 400 (H) 05/12/2020   Lab Results  Component Value Date   CHOLHDL 6.1 (H) 05/12/2020   Lab Results  Component Value Date   HGBA1C 5.6 05/12/2020      Assessment & Plan:   1. Mixed hyperlipidemia - He will continue on current treatment regimen - rosuvastatin (CRESTOR) 5 MG tablet; Take 1 tablet (5 mg total) by mouth daily.  Dispense: 30 tablet; Refill: 3 - Lipid panel; Future - Continue on low fat, low cholesterol diet and  exercise as tolerated  2. Essential hypertension - BP under good control, he will continue on current treatment regimen - metoprolol tartrate (LOPRESSOR) 50 MG tablet; Take 1 tablet (50 mg total) by mouth 2 (two) times daily.  Dispense: 60 tablet; Refill: 3 - Encouraged him to continue monitoring BP at home and keep a log  - Encouraged increased exercise as tolerated   3. Vision changes  -He has the contact information for an ophthalmologist and will schedule this soon   4. Constipation, unspecified constipation type  - Encouraged high fiber diet  - Encouraged to schedule colonoscopy    Follow-up: Return in about 9 weeks (around 08/18/2020), or if symptoms worsen or fail to improve.    Clare Charon, [place]

## 2020-06-25 ENCOUNTER — Other Ambulatory Visit: Payer: Self-pay

## 2020-06-25 ENCOUNTER — Telehealth (INDEPENDENT_AMBULATORY_CARE_PROVIDER_SITE_OTHER): Payer: Self-pay | Admitting: Gastroenterology

## 2020-06-25 DIAGNOSIS — Z1211 Encounter for screening for malignant neoplasm of colon: Secondary | ICD-10-CM

## 2020-06-25 NOTE — Progress Notes (Signed)
Gastroenterology Pre-Procedure Review  Request Date: Tuesday 07/06/20 Requesting Physician: Dr. Anna  PATIENT REVIEW QUESTIONS: The patient responded to the following health history questions as indicated:    1. Are you having any GI issues? no 2. Do you have a personal history of Polyps? no 3. Do you have a family history of Colon Cancer or Polyps? no 4. Diabetes Mellitus? no 5. Joint replacements in the past 12 months?no 6. Major health problems in the past 3 months?no 7. Any artificial heart valves, MVP, or defibrillator?no    MEDICATIONS & ALLERGIES:    Patient reports the following regarding taking any anticoagulation/antiplatelet therapy:   Plavix, Coumadin, Eliquis, Xarelto, Lovenox, Pradaxa, Brilinta, or Effient? no Aspirin? no  Patient confirms/reports the following medications:  Current Outpatient Medications  Medication Sig Dispense Refill  . Blood Pressure KIT 1 kit by Does not apply route daily. 1 kit 0  . meloxicam (MOBIC) 7.5 MG tablet Take 1 tablet (7.5 mg total) by mouth 2 (two) times daily as needed for pain. 60 tablet 0  . metoprolol tartrate (LOPRESSOR) 50 MG tablet Take 1 tablet (50 mg total) by mouth 2 (two) times daily. 60 tablet 3  . omeprazole (PRILOSEC) 20 MG capsule Take 1 capsule (20 mg total) by mouth daily. 30 capsule 2  . rosuvastatin (CRESTOR) 5 MG tablet Take 1 tablet (5 mg total) by mouth daily. 30 tablet 3  . triamcinolone cream (KENALOG) 0.1 % Apply 1 application topically 2 (two) times daily. 30 g 2  . cariprazine (VRAYLAR) capsule Take 1.5 mg by mouth daily. (Patient not taking: Reported on 06/16/2020)     No current facility-administered medications for this visit.    Patient confirms/reports the following allergies:  Allergies  Allergen Reactions  . Saphris [Asenapine] Other (See Comments)    Other reaction(s): Other (See Comments) Reaction:Sores in pts mouth, nose, and ears Reaction:  Sores in pts mouth, nose, and ears    Orders  Placed This Encounter  Procedures  . Procedural/ Surgical Case Request: COLONOSCOPY WITH PROPOFOL    Standing Status:   Standing    Number of Occurrences:   1    Order Specific Question:   Pre-op diagnosis    Answer:   screening colonoscopy    Order Specific Question:   CPT Code    Answer:   45378    AUTHORIZATION INFORMATION Primary Insurance: 1D#: Group #:  Secondary Insurance: 1D#: Group #:  SCHEDULE INFORMATION: Date: Friday 07/02/20 Time: Location:[facility] 

## 2020-07-02 ENCOUNTER — Other Ambulatory Visit
Admission: RE | Admit: 2020-07-02 | Discharge: 2020-07-02 | Disposition: A | Discharge status: Home / Self Care | Payer: Medicaid Other | Source: Ambulatory Visit | Attending: Gastroenterology | Admitting: Gastroenterology

## 2020-07-02 ENCOUNTER — Other Ambulatory Visit: Payer: Self-pay

## 2020-07-02 DIAGNOSIS — Z20822 Contact with and (suspected) exposure to covid-19: Secondary | ICD-10-CM | POA: Diagnosis not present

## 2020-07-02 DIAGNOSIS — Z01812 Encounter for preprocedural laboratory examination: Secondary | ICD-10-CM | POA: Diagnosis not present

## 2020-07-03 LAB — SARS CORONAVIRUS 2 (TAT 6-24 HRS): SARS Coronavirus 2: NEGATIVE

## 2020-07-06 ENCOUNTER — Ambulatory Visit: Admission: RE | Admit: 2020-07-06 | Payer: Medicaid Other | Source: Ambulatory Visit | Admitting: Gastroenterology

## 2020-07-06 ENCOUNTER — Encounter: Admission: RE | Payer: Self-pay | Source: Ambulatory Visit

## 2020-07-06 SURGERY — COLONOSCOPY WITH PROPOFOL
Anesthesia: General

## 2020-07-22 ENCOUNTER — Other Ambulatory Visit: Payer: Self-pay

## 2020-07-22 ENCOUNTER — Ambulatory Visit: Payer: Medicaid Other | Admitting: Gerontology

## 2020-07-22 VITALS — BP 123/87 | HR 73 | Wt 242.3 lb

## 2020-07-22 DIAGNOSIS — H6122 Impacted cerumen, left ear: Secondary | ICD-10-CM | POA: Insufficient documentation

## 2020-07-22 DIAGNOSIS — J3489 Other specified disorders of nose and nasal sinuses: Secondary | ICD-10-CM | POA: Insufficient documentation

## 2020-07-22 DIAGNOSIS — Z8719 Personal history of other diseases of the digestive system: Secondary | ICD-10-CM

## 2020-07-22 MED ORDER — OMEPRAZOLE 20 MG PO CPDR: 20.0000 mg | DELAYED_RELEASE_CAPSULE | Freq: Every day | ORAL | 2 refills | Status: DC

## 2020-07-22 MED ORDER — DEBROX 6.5 % OT SOLN: 5.0000 [drp] | Freq: Two times a day (BID) | OTIC | 0 refills | Status: DC

## 2020-07-22 MED ORDER — SALINE SPRAY 0.65 % NA SOLN: 1.0000 | NASAL | 0 refills | Status: AC | PRN

## 2020-07-22 NOTE — Progress Notes (Signed)
Established Patient Office Visit  Subjective:  Patient ID: Wayne Roy, male    DOB: 1956-06-15  Age: 64 y.o. MRN: [id number]  CC: No chief complaint on file.   HPI Wayne Roy presents for c/o stuffed left ear and loss of hearing, that has been going on for 6 months and medication refill. He states that his left ear feels full, endorses experiencing otalgia, post nasal drip, maxillary and frontal sinus pressure. He denies tinnitus, vertigo and otorrhea. He denies any recent upper respiratory infection or sick contacts. He continues to smoke 1 pack of cigarette and admits the desire to quit. Overall, he states that he's doing well, but worried about his left ear.  Past Medical History:  Diagnosis Date  . Arthritis   . Bipolar 1 disorder ([place])   . Hypertension   . Hypothyroidism   . Myocardial infarction ([place])     No past surgical history on file.  Family History  Problem Relation Age of Onset  . Arthritis Mother   . Bipolar disorder Mother   . Hypertension Sister     Social History   Socioeconomic History  . Marital status: Divorced    Spouse name: Not on file  . Number of children: Not on file  . Years of education: Not on file  . Highest education level: Not on file  Occupational History  . Not on file  Tobacco Use  . Smoking status: Current Every Day Smoker    Packs/day: 0.50    Years: 45.00    Pack years: 22.50  . Smokeless tobacco: Never Used  . Tobacco comment: 1 pack every 3 days  Vaping Use  . Vaping Use: Never used  Substance and Sexual Activity  . Alcohol use: No  . Drug use: No  . Sexual activity: Not Currently  Other Topics Concern  . Not on file  Social History Narrative   In prison for 10 years   Social Determinants of Health   Financial Resource Strain: High Risk  . Difficulty of Paying Living Expenses: Very hard  Food Insecurity: No Food Insecurity  . Worried About Charity fundraiser in the Last Year: Never true  . Ran Out of Food  in the Last Year: Never true  Transportation Needs: Unmet Transportation Needs  . Lack of Transportation (Medical): No  . Lack of Transportation (Non-Medical): Yes  Physical Activity: Inactive  . Days of Exercise per Week: 0 days  . Minutes of Exercise per Session: 0 min  Stress: Stress Concern Present  . Feeling of Stress : Rather much  Social Connections: Socially Isolated  . Frequency of Communication with Friends and Family: Once a week  . Frequency of Social Gatherings with Friends and Family: Once a week  . Attends Religious Services: Never  . Active Member of Clubs or Organizations: No  . Attends Archivist Meetings: Never  . Marital Status: Divorced  Human resources officer Violence: Not At Risk  . Fear of Current or Ex-Partner: No  . Emotionally Abused: No  . Physically Abused: No  . Sexually Abused: No    Outpatient Medications Prior to Visit  Medication Sig Dispense Refill  . Blood Pressure KIT 1 kit by Does not apply route daily. 1 kit 0  . cariprazine (VRAYLAR) capsule Take 1.5 mg by mouth daily. (Patient not taking: Reported on 06/16/2020)    . metoprolol tartrate (LOPRESSOR) 50 MG tablet Take 1 tablet (50 mg total) by mouth 2 (two) times daily. [place]  tablet 3  . rosuvastatin (CRESTOR) 5 MG tablet Take 1 tablet (5 mg total) by mouth daily. 30 tablet 3  . triamcinolone cream (KENALOG) 0.1 % Apply 1 application topically 2 (two) times daily. 30 g 2  . meloxicam (MOBIC) 7.5 MG tablet Take 1 tablet (7.5 mg total) by mouth 2 (two) times daily as needed for pain. 60 tablet 0  . omeprazole (PRILOSEC) 20 MG capsule Take 1 capsule (20 mg total) by mouth daily. 30 capsule 2   No facility-administered medications prior to visit.    Allergies  Allergen Reactions  . Saphris [Asenapine] Other (See Comments)    Other reaction(s): Other (See Comments) Reaction:Sores in pts mouth, nose, and ears Reaction:  Sores in pts mouth, nose, and ears    ROS Review of Systems   Constitutional: Negative.   HENT: Positive for ear pain, hearing loss, postnasal drip and sinus pressure. Negative for ear discharge and tinnitus.   Respiratory: Negative.   Cardiovascular: Negative.   Neurological: Negative.   Psychiatric/Behavioral: Negative.       Objective:    Physical Exam HENT:     Left Ear: Decreased hearing noted. There is impacted cerumen.  Cardiovascular:     Rate and Rhythm: Normal rate and regular rhythm.     Pulses: Normal pulses.     Heart sounds: Normal heart sounds.  Pulmonary:     Effort: Pulmonary effort is normal.     Breath sounds: Normal breath sounds.  Neurological:     General: No focal deficit present.     Mental Status: He is alert and oriented to person, place, and time. Mental status is at baseline.  Psychiatric:        Mood and Affect: Mood normal.        Behavior: Behavior normal.        Thought Content: Thought content normal.        Judgment: Judgment normal.     BP 123/87 (BP Location: Left Arm, Patient Position: Sitting, Cuff Size: Large)   Pulse 73   Wt 242 lb 4.8 oz (109.9 kg)   BMI 35.78 kg/m  Wt Readings from Last 3 Encounters:  07/22/20 242 lb 4.8 oz (109.9 kg)  06/16/20 246 lb (111.6 kg)  05/20/20 249 lb (112.9 kg)   He was encouraged to continue on his weight loss regimen  Health Maintenance Due  Topic Date Due  . Hepatitis C Screening  Never done  . HIV Screening  Never done  . TETANUS/TDAP  Never done  . COLONOSCOPY  Never done  . INFLUENZA VACCINE  07/11/2020    There are no preventive care reminders to display for this patient.  Lab Results  Component Value Date   TSH 4.060 04/20/2020   Lab Results  Component Value Date   WBC 8.8 05/03/2020   HGB 16.4 05/03/2020   HCT 46.9 05/03/2020   MCV 91.6 05/03/2020   PLT 252 05/03/2020   Lab Results  Component Value Date   NA 137 05/03/2020   K 3.8 05/03/2020   CO2 21 (L) 05/03/2020   GLUCOSE 146 (H) 05/03/2020   BUN 21 05/03/2020    CREATININE 1.08 05/03/2020   BILITOT 0.7 05/03/2020   ALKPHOS 73 05/03/2020   AST 33 05/03/2020   ALT 45 (H) 05/03/2020   PROT 8.0 05/03/2020   ALBUMIN 4.2 05/03/2020   CALCIUM 9.5 05/03/2020   ANIONGAP 12 05/03/2020   Lab Results  Component Value Date   CHOL 190 05/12/2020  Lab Results  Component Value Date   HDL 31 (L) 05/12/2020   Lab Results  Component Value Date   LDLCALC 93 05/12/2020   Lab Results  Component Value Date   TRIG 400 (H) 05/12/2020   Lab Results  Component Value Date   CHOLHDL 6.1 (H) 05/12/2020   Lab Results  Component Value Date   HGBA1C 5.6 05/12/2020      Assessment & Plan:    1. History of gastroesophageal reflux (GERD) - His Jerrye Bushy is under control, he will continue on current treatment regimen. - omeprazole (PRILOSEC) 20 MG capsule; Take 1 capsule (20 mg total) by mouth daily.  Dispense: 30 capsule; Refill: 2 -Avoid spicy, fatty and fried food -Avoid sodas and sour juices -Avoid heavy meals -Avoid eating 4 hours before bedtime -Elevate head of bed at night   2. Impacted cerumen of left ear - His left ear was impacted with Cerumen, he will use Debrox, educated on medication side effects and advised to notify clinic. - carbamide peroxide (DEBROX) 6.5 % OTIC solution; Place 5 drops into the left ear 2 (two) times daily.  Dispense: 15 mL; Refill: 0  3. Sinus pressure - He was advised to take 650 mg Tylenol every 12 hours as needed and use Saline spray. - sodium chloride (OCEAN) 0.65 % SOLN nasal spray; Place 1 spray into both nostrils as needed for congestion.  Dispense: 30 mL; Refill: 0    Follow-up: Return in about 3 weeks (around 08/12/2020), or if symptoms worsen or fail to improve.    Draya Felker Jerold Coombe, NP

## 2020-08-03 ENCOUNTER — Other Ambulatory Visit: Payer: Self-pay | Admitting: Gerontology

## 2020-08-03 DIAGNOSIS — L409 Psoriasis, unspecified: Secondary | ICD-10-CM

## 2020-08-11 ENCOUNTER — Other Ambulatory Visit: Payer: Self-pay

## 2020-08-11 DIAGNOSIS — E782 Mixed hyperlipidemia: Secondary | ICD-10-CM

## 2020-08-12 ENCOUNTER — Ambulatory Visit: Payer: Medicaid Other | Admitting: Gerontology

## 2020-08-12 LAB — LIPID PANEL
Chol/HDL Ratio: 4.5 ratio (ref 0.0–5.0)
Cholesterol, Total: 145 mg/dL (ref 100–199)
HDL: 32 mg/dL — ABNORMAL LOW (ref >39)
LDL Chol Calc (NIH): 68 mg/dL (ref 0–99)
Triglycerides: 278 mg/dL — ABNORMAL HIGH (ref 0–149)
VLDL Cholesterol Cal: 45 mg/dL — ABNORMAL HIGH (ref 5–40)

## 2020-08-18 ENCOUNTER — Ambulatory Visit: Payer: Medicaid Other | Admitting: Gerontology

## 2020-08-24 ENCOUNTER — Other Ambulatory Visit: Payer: Self-pay | Admitting: Gerontology

## 2020-08-24 DIAGNOSIS — L409 Psoriasis, unspecified: Secondary | ICD-10-CM

## 2020-09-03 ENCOUNTER — Other Ambulatory Visit: Payer: Self-pay | Admitting: Gerontology

## 2020-09-03 DIAGNOSIS — L409 Psoriasis, unspecified: Secondary | ICD-10-CM

## 2020-10-10 ENCOUNTER — Other Ambulatory Visit: Payer: Self-pay

## 2020-10-10 DIAGNOSIS — M5441 Lumbago with sciatica, right side: Secondary | ICD-10-CM | POA: Insufficient documentation

## 2020-10-10 DIAGNOSIS — Z5321 Procedure and treatment not carried out due to patient leaving prior to being seen by health care provider: Secondary | ICD-10-CM | POA: Insufficient documentation

## 2020-10-10 NOTE — ED Notes (Signed)
URINE COLLECTED AND IN LAB IF NEEDED

## 2020-10-10 NOTE — ED Triage Notes (Signed)
Pt assisted out of car into wheelchair in parking lot. Pt states for about a week has had right sided low back pain. Pt states has progressively gotten worse. Pt states tonight when he picked up a mop began to have severe pain, pt is ambulatory with difficulty. Denies fever or known hematuria.

## 2020-10-11 ENCOUNTER — Emergency Department
Admission: EM | Admit: 2020-10-11 | Discharge: 2020-10-11 | Disposition: A | Discharge status: Home / Self Care | Payer: Medicaid Other | Attending: Emergency Medicine | Admitting: Emergency Medicine

## 2020-10-11 NOTE — ED Notes (Signed)
Patient given update regarding wait time, verbalized understanding.  Patient given face mask and reminded to keep it on while in ED lobby.

## 2020-10-12 ENCOUNTER — Telehealth: Payer: Self-pay | Admitting: Gerontology

## 2020-10-14 ENCOUNTER — Ambulatory Visit: Payer: Medicaid Other | Admitting: Gerontology

## 2020-10-14 ENCOUNTER — Other Ambulatory Visit: Payer: Self-pay | Admitting: Gerontology

## 2020-10-14 ENCOUNTER — Other Ambulatory Visit: Payer: Self-pay

## 2020-10-14 VITALS — BP 147/89 | HR 105 | Temp 97.9°F | Resp 18 | Wt 214.0 lb

## 2020-10-14 DIAGNOSIS — E782 Mixed hyperlipidemia: Secondary | ICD-10-CM

## 2020-10-14 DIAGNOSIS — M545 Low back pain, unspecified: Secondary | ICD-10-CM

## 2020-10-14 DIAGNOSIS — Z8719 Personal history of other diseases of the digestive system: Secondary | ICD-10-CM

## 2020-10-14 DIAGNOSIS — R0981 Nasal congestion: Secondary | ICD-10-CM

## 2020-10-14 DIAGNOSIS — G8929 Other chronic pain: Secondary | ICD-10-CM

## 2020-10-14 DIAGNOSIS — F17200 Nicotine dependence, unspecified, uncomplicated: Secondary | ICD-10-CM

## 2020-10-14 DIAGNOSIS — I10 Essential (primary) hypertension: Secondary | ICD-10-CM

## 2020-10-14 DIAGNOSIS — Z0000 Encounter for general adult medical examination without abnormal findings: Secondary | ICD-10-CM

## 2020-10-14 MED ORDER — GUAIFENESIN 200 MG PO TABS: 200.0000 mg | ORAL_TABLET | ORAL | 0 refills | Status: AC | PRN

## 2020-10-14 MED ORDER — METOPROLOL TARTRATE 50 MG PO TABS: 50.0000 mg | ORAL_TABLET | Freq: Two times a day (BID) | ORAL | 3 refills | Status: DC

## 2020-10-14 MED ORDER — CYCLOBENZAPRINE HCL 5 MG PO TABS: 5.0000 mg | ORAL_TABLET | Freq: Two times a day (BID) | ORAL | 0 refills | Status: DC

## 2020-10-14 MED ORDER — OMEPRAZOLE 20 MG PO CPDR: 20.0000 mg | DELAYED_RELEASE_CAPSULE | Freq: Every day | ORAL | 2 refills | Status: AC

## 2020-10-14 MED ORDER — ROSUVASTATIN CALCIUM 5 MG PO TABS: 5.0000 mg | ORAL_TABLET | Freq: Every day | ORAL | 3 refills | Status: DC

## 2020-10-14 NOTE — Patient Instructions (Signed)
Fat and Cholesterol Restricted Eating Plan Getting too much fat and cholesterol in your diet may cause health problems. Choosing the right foods helps keep your fat and cholesterol at normal levels. This can keep you from getting certain diseases. Your doctor may recommend an eating plan that includes:  Total fat: ______% or less of total calories a day.  Saturated fat: ______% or less of total calories a day.  Cholesterol: less than _________mg a day.  Fiber: ______g a day. What are tips for following this plan? Meal planning  At meals, divide your plate into four equal parts: ? Fill one-half of your plate with vegetables and green salads. ? Fill one-fourth of your plate with whole grains. ? Fill one-fourth of your plate with low-fat (lean) protein foods.  Eat fish that is high in omega-3 fats at least two times a week. This includes mackerel, tuna, sardines, and salmon.  Eat foods that are high in fiber, such as whole grains, beans, apples, broccoli, carrots, peas, and barley. General tips   Work with your doctor to lose weight if you need to.  Avoid: ? Foods with added sugar. ? Fried foods. ? Foods with partially hydrogenated oils.  Limit alcohol intake to no more than 1 drink a day for nonpregnant women and 2 drinks a day for men. One drink equals 12 oz of beer, 5 oz of wine, or 1 oz of hard liquor. Reading food labels  Check food labels for: ? Trans fats. ? Partially hydrogenated oils. ? Saturated fat (g) in each serving. ? Cholesterol (mg) in each serving. ? Fiber (g) in each serving.  Choose foods with healthy fats, such as: ? Monounsaturated fats. ? Polyunsaturated fats. ? Omega-3 fats.  Choose grain products that have whole grains. Look for the word "whole" as the first word in the ingredient list. Cooking  Cook foods using low-fat methods. These include baking, boiling, grilling, and broiling.  Eat more home-cooked foods. Eat at restaurants and buffets  less often.  Avoid cooking using saturated fats, such as butter, cream, palm oil, palm kernel oil, and coconut oil. Recommended foods  Fruits  All fresh, canned (in natural juice), or frozen fruits. Vegetables  Fresh or frozen vegetables (raw, steamed, roasted, or grilled). Green salads. Grains  Whole grains, such as whole wheat or whole grain breads, crackers, cereals, and pasta. Unsweetened oatmeal, bulgur, barley, quinoa, or brown rice. Corn or whole wheat flour tortillas. Meats and other protein foods  Ground beef (85% or leaner), grass-fed beef, or beef trimmed of fat. Skinless chicken or turkey. Ground chicken or turkey. Pork trimmed of fat. All fish and seafood. Egg whites. Dried beans, peas, or lentils. Unsalted nuts or seeds. Unsalted canned beans. Nut butters without added sugar or oil. Dairy  Low-fat or nonfat dairy products, such as skim or 1% milk, 2% or reduced-fat cheeses, low-fat and fat-free ricotta or cottage cheese, or plain low-fat and nonfat yogurt. Fats and oils  Tub margarine without trans fats. Light or reduced-fat mayonnaise and salad dressings. Avocado. Olive, canola, sesame, or safflower oils. The items listed above may not be a complete list of foods and beverages you can eat. Contact a dietitian for more information. Foods to avoid Fruits  Canned fruit in heavy syrup. Fruit in cream or butter sauce. Fried fruit. Vegetables  Vegetables cooked in cheese, cream, or butter sauce. Fried vegetables. Grains  White bread. White pasta. White rice. Cornbread. Bagels, pastries, and croissants. Crackers and snack foods that contain trans fat   and hydrogenated oils. Meats and other protein foods  Fatty cuts of meat. Ribs, chicken wings, bacon, sausage, bologna, salami, chitterlings, fatback, hot dogs, bratwurst, and packaged lunch meats. Liver and organ meats. Whole eggs and egg yolks. Chicken and turkey with skin. Fried meat. Dairy  Whole or 2% milk, cream,  half-and-half, and cream cheese. Whole milk cheeses. Whole-fat or sweetened yogurt. Full-fat cheeses. Nondairy creamers and whipped toppings. Processed cheese, cheese spreads, and cheese curds. Beverages  Alcohol. Sugar-sweetened drinks such as sodas, lemonade, and fruit drinks. Fats and oils  Butter, stick margarine, lard, shortening, ghee, or bacon fat. Coconut, palm kernel, and palm oils. Sweets and desserts  Corn syrup, sugars, honey, and molasses. Candy. Jam and jelly. Syrup. Sweetened cereals. Cookies, pies, cakes, donuts, muffins, and ice cream. The items listed above may not be a complete list of foods and beverages you should avoid. Contact a dietitian for more information. Summary  Choosing the right foods helps keep your fat and cholesterol at normal levels. This can keep you from getting certain diseases.  At meals, fill one-half of your plate with vegetables and green salads.  Eat high-fiber foods, like whole grains, beans, apples, carrots, peas, and barley.  Limit added sugar, saturated fats, alcohol, and fried foods. This information is not intended to replace advice given to you by your health care provider. Make sure you discuss any questions you have with your health care provider. Document Revised: 07/31/2018 Document Reviewed: 08/14/2017 Elsevier Patient Education  2020 Else[employer] Inc[employer] DASH Eating Plan DASH stands for "Dietary Approaches to Stop Hypertension." The DASH eating plan is a healthy eating plan that has been shown to reduce high blood pressure (hypertension). It may also reduce your risk for type 2 diabetes, heart disease, and stroke. The DASH eating plan may also help with weight loss. What are tips for following this plan?  General guidelines  Avoid eating more than 2,300 mg (milligrams) of salt (sodium) a day. If you have hypertension, you may need to reduce your sodium intake to 1,500 mg a day.  Limit alcohol intake to no more than 1 drink a day for  nonpregnant women and 2 drinks a day for men. One drink equals 12 oz of beer, 5 oz of wine, or 1 oz of hard liquor.  Work with your health care provider to maintain a healthy body weight or to lose weight. Ask what an ideal weight is for you.  Get at least 30 minutes of exercise that causes your heart to beat faster (aerobic exercise) most days of the week. Activities may include walking, swimming, or biking.  Work with your health care provider or diet and nutrition specialist (dietitian) to adjust your eating plan to your individual calorie needs. Reading food labels   Check food labels for the amount of sodium per serving. Choose foods with less than 5 percent of the Daily Value of sodium. Generally, foods with less than 300 mg of sodium per serving fit into this eating plan.  To find whole grains, look for the word "whole" as the first word in the ingredient list. Shopping  Buy products labeled as "low-sodium" or "no salt added."  Buy fresh foods. Avoid canned foods and premade or frozen meals. Cooking  Avoid adding salt when cooking. Use salt-free seasonings or herbs instead of table salt or sea salt. Check with your health care provider or pharmacist before using salt substitutes.  Do not fry foods. Cook foods using healthy methods such as baking, boiling, grilling,   and broiling instead.  Cook with heart-healthy oils, such as olive, canola, soybean, or sunflower oil. Meal planning  Eat a balanced diet that includes: ? 5 or more servings of fruits and vegetables each day. At each meal, try to fill half of your plate with fruits and vegetables. ? Up to 6-8 servings of whole grains each day. ? Less than 6 oz of lean meat, poultry, or fish each day. A 3-oz serving of meat is about the same size as a deck of cards. One egg equals 1 oz. ? 2 servings of low-fat dairy each day. ? A serving of nuts, seeds, or beans 5 times each week. ? Heart-healthy fats. Healthy fats called Omega-3  fatty acids are found in foods such as flaxseeds and coldwater fish, like sardines, salmon, and mackerel.  Limit how much you eat of the following: ? Canned or prepackaged foods. ? Food that is high in trans fat, such as fried foods. ? Food that is high in saturated fat, such as fatty meat. ? Sweets, desserts, sugary drinks, and other foods with added sugar. ? Full-fat dairy products.  Do not salt foods before eating.  Try to eat at least 2 vegetarian meals each week.  Eat more home-cooked food and less restaurant, buffet, and fast food.  When eating at a restaurant, ask that your food be prepared with less salt or no salt, if possible. What foods are recommended? The items listed may not be a complete list. Talk with your dietitian about what dietary choices are best for you. Grains Whole-grain or whole-wheat bread. Whole-grain or whole-wheat pasta. Brown rice. Oatmeal. Quinoa. Bulgur. Whole-grain and low-sodium cereals. Pita bread. Low-fat, low-sodium crackers. Whole-wheat flour tortillas. Vegetables Fresh or frozen vegetables (raw, steamed, roasted, or grilled). Low-sodium or reduced-sodium tomato and vegetable juice. Low-sodium or reduced-sodium tomato sauce and tomato paste. Low-sodium or reduced-sodium canned vegetables. Fruits All fresh, dried, or frozen fruit. Canned fruit in natural juice (without added sugar). Meat and other protein foods Skinless chicken or turkey. Ground chicken or turkey. Pork with fat trimmed off. Fish and seafood. Egg whites. Dried beans, peas, or lentils. Unsalted nuts, nut butters, and seeds. Unsalted canned beans. Lean cuts of beef with fat trimmed off. Low-sodium, lean deli meat. Dairy Low-fat (1%) or fat-free (skim) milk. Fat-free, low-fat, or reduced-fat cheeses. Nonfat, low-sodium ricotta or cottage cheese. Low-fat or nonfat yogurt. Low-fat, low-sodium cheese. Fats and oils Soft margarine without trans fats. Vegetable oil. Low-fat, reduced-fat, or  light mayonnaise and salad dressings (reduced-sodium). Canola, safflower, olive, soybean, and sunflower oils. Avocado. Seasoning and other foods Herbs. Spices. Seasoning mixes without salt. Unsalted popcorn and pretzels. Fat-free sweets. What foods are not recommended? The items listed may not be a complete list. Talk with your dietitian about what dietary choices are best for you. Grains Baked goods made with fat, such as croissants, muffins, or some breads. Dry pasta or rice meal packs. Vegetables Creamed or fried vegetables. Vegetables in a cheese sauce. Regular canned vegetables (not low-sodium or reduced-sodium). Regular canned tomato sauce and paste (not low-sodium or reduced-sodium). Regular tomato and vegetable juice (not low-sodium or reduced-sodium). Pickles. Olives. Fruits Canned fruit in a light or heavy syrup. Fried fruit. Fruit in cream or butter sauce. Meat and other protein foods Fatty cuts of meat. Ribs. Fried meat. Bacon. Sausage. Bologna and other processed lunch meats. Salami. Fatback. Hotdogs. Bratwurst. Salted nuts and seeds. Canned beans with added salt. Canned or smoked fish. Whole eggs or egg yolks. Chicken or turkey   with skin. Dairy Whole or 2% milk, cream, and half-and-half. Whole or full-fat cream cheese. Whole-fat or sweetened yogurt. Full-fat cheese. Nondairy creamers. Whipped toppings. Processed cheese and cheese spreads. Fats and oils Butter. Stick margarine. Lard. Shortening. Ghee. Bacon fat. Tropical oils, such as coconut, palm kernel, or palm oil. Seasoning and other foods Salted popcorn and pretzels. Onion salt, garlic salt, seasoned salt, table salt, and sea salt. Worcestershire sauce. Tartar sauce. Barbecue sauce. Teriyaki sauce. Soy sauce, including reduced-sodium. Steak sauce. Canned and packaged gravies. Fish sauce. Oyster sauce. Cocktail sauce. Horseradish that you find on the shelf. Ketchup. Mustard. Meat flavorings and tenderizers. Bouillon cubes. Hot  sauce and Tabasco sauce. Premade or packaged marinades. Premade or packaged taco seasonings. Relishes. Regular salad dressings. Where to find more information:  National Heart, Lung, and Blood Institute: www.nhlbi.nih.gov  American Heart Association: www.heart.org Summary  The DASH eating plan is a healthy eating plan that has been shown to reduce high blood pressure (hypertension). It may also reduce your risk for type 2 diabetes, heart disease, and stroke.  With the DASH eating plan, you should limit salt (sodium) intake to 2,300 mg a day. If you have hypertension, you may need to reduce your sodium intake to 1,500 mg a day.  When on the DASH eating plan, aim to eat more fresh fruits and vegetables, whole grains, lean proteins, low-fat dairy, and heart-healthy fats.  Work with your health care provider or diet and nutrition specialist (dietitian) to adjust your eating plan to your individual calorie needs. This information is not intended to replace advice given to you by your health care provider. Make sure you discuss any questions you have with your health care provider. Document Revised: 11/09/2017 Document Reviewed: 11/20/2016 Elsevier Patient Education  2020 Elsevier Inc.  

## 2020-10-14 NOTE — Progress Notes (Signed)
Established Patient Office Visit  Subjective:  Patient ID: Wayne Roy, male    DOB: 1956/11/05  Age: 64 y.o. MRN: [id number]  CC: No chief complaint on file.   HPI Wayne Roy presents for general follow up and lower back pain. He reports that he needs refills of his home medications because he lost them in a move. States that he has not been on any of his medications in at least 1 month. He is specifically requesting a prescription for mucinex for nasal congestion. He states that he get congested with the weather change every year and mucinex generally relieves his symptoms. 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 He states that he did not fall, but his back "caught." He states that the pain worsening with movement and it usually best in the morning and worsens throughout the day. Also states that twisting or sitting aggravates the pain. Standing and rest ease the pain. He denies urinary or bowel incontinence. He denies new peripheral numbness or tingling. Pain does not radiate. He states that he took 2 oxycontin and half a pint of liquor last night for pain relief. Pain is a 3/10 at present. Also requesting a referral for an eye exam. He states that he needs new glasses. No vision loss, pain or changes. Overall, patient states that he is doing well. He offers no further complaint.   Past Medical History:  Diagnosis Date  . Arthritis   . Bipolar 1 disorder ([place])   . Hypertension   . Hypothyroidism   . Myocardial infarction ([place])     No past surgical history on file.  Family History  Problem Relation Age of Onset  . Arthritis Mother   . Bipolar disorder Mother   . Hypertension Sister     Social History   Socioeconomic History  . Marital status: Divorced    Spouse name: Not on file  . Number of children: Not on file  . Years of  education: Not on file  . Highest education level: Not on file  Occupational History  . Not on file  Tobacco Use  . Smoking status: Current Every Day Smoker    Packs/day: 0.50    Years: 45.00    Pack years: 22.50  . Smokeless tobacco: Never Used  . Tobacco comment: 1 pack every 3 days  Vaping Use  . Vaping Use: Never used  Substance and Sexual Activity  . Alcohol use: No  . Drug use: No  . Sexual activity: Not Currently  Other Topics Concern  . Not on file  Social History Narrative   In prison for 10 years   Social Determinants of Health   Financial Resource Strain: High Risk  . Difficulty of Paying Living Expenses: Very hard  Food Insecurity: No Food Insecurity  . Worried About Charity fundraiser in the Last Year: Never true  . Ran Out of Food in the Last Year: Never true  Transportation Needs: Unmet Transportation Needs  . Lack of Transportation (Medical): No  . Lack of Transportation (Non-Medical): Yes  Physical Activity: Inactive  . Days of Exercise per Week: 0 days  . Minutes of Exercise per Session: 0 min  Stress: Stress Concern Present  . Feeling of Stress : Rather much  Social Connections: Socially Isolated  . Frequency of  Communication with Friends and Family: Once a week  . Frequency of Social Gatherings with Friends and Family: Once a week  . Attends Religious Services: Never  . Active Member of Clubs or Organizations: No  . Attends Archivist Meetings: Never  . Marital Status: Divorced  Human resources officer Violence: Not At Risk  . Fear of Current or Ex-Partner: No  . Emotionally Abused: No  . Physically Abused: No  . Sexually Abused: No    Outpatient Medications Prior to Visit  Medication Sig Dispense Refill  . Blood Pressure KIT 1 kit by Does not apply route daily. 1 kit 0  . cariprazine (VRAYLAR) capsule Take 1.5 mg by mouth daily. (Patient not taking: Reported on 06/16/2020)    . sodium chloride (OCEAN) 0.65 % SOLN nasal spray Place 1 spray  into both nostrils as needed for congestion. 30 mL 0  . triamcinolone cream (KENALOG) 0.1 % APPLY ONE APPLICATION TOPICALLY 2 TIMES A DAY. 454 g 0  . carbamide peroxide (DEBROX) 6.5 % OTIC solution Place 5 drops into the left ear 2 (two) times daily. 15 mL 0  . metoprolol tartrate (LOPRESSOR) 50 MG tablet Take 1 tablet (50 mg total) by mouth 2 (two) times daily. 60 tablet 3  . omeprazole (PRILOSEC) 20 MG capsule Take 1 capsule (20 mg total) by mouth daily. 30 capsule 2  . rosuvastatin (CRESTOR) 5 MG tablet Take 1 tablet (5 mg total) by mouth daily. 30 tablet 3   No facility-administered medications prior to visit.    Allergies  Allergen Reactions  . Saphris [Asenapine] Other (See Comments)    Other reaction(s): Other (See Comments) Reaction:Sores in pts mouth, nose, and ears Reaction:  Sores in pts mouth, nose, and ears    ROS Review of Systems  Constitutional: Negative.   HENT: Positive for congestion.   Eyes: Negative.   Respiratory: Negative.   Cardiovascular: Negative.   Gastrointestinal: Negative.   Endocrine: Negative.   Genitourinary: Negative.   Musculoskeletal: Positive for back pain.  Skin: Negative.   Neurological: Negative.   Psychiatric/Behavioral: Negative.       Objective:    Physical Exam Constitutional:      Appearance: Normal appearance.  HENT:     Nose: Congestion and rhinorrhea present.     Mouth/Throat:     Mouth: Mucous membranes are moist.     Pharynx: Oropharynx is clear.  Cardiovascular:     Rate and Rhythm: Normal rate and regular rhythm.     Heart sounds: Normal heart sounds.  Pulmonary:     Effort: Pulmonary effort is normal.     Breath sounds: Normal breath sounds.  Abdominal:     General: Abdomen is flat. Bowel sounds are normal.     Palpations: Abdomen is soft.  Musculoskeletal:     Lumbar back: Spasms present. Decreased range of motion.       Back:  Skin:    General: Skin is warm and dry.     Capillary Refill: Capillary  refill takes less than 2 seconds.  Neurological:     General: No focal deficit present.     Mental Status: He is alert and oriented to person, place, and time.     BP (!) 147/89   Pulse (!) 105   Resp 18   Wt 214 lb (97.1 kg)   SpO2 99%   BMI 30.71 kg/m  Wt Readings from Last 3 Encounters:  10/14/20 214 lb (97.1 kg)  10/10/20 200 lb (90.7 kg)  07/22/20 242 lb 4.8 oz (109.9 kg)     Health Maintenance Due  Topic Date Due  . Hepatitis C Screening  Never done  . HIV Screening  Never done  . TETANUS/TDAP  Never done  . COLONOSCOPY  Never done  . INFLUENZA VACCINE  Never done    There are no preventive care reminders to display for this patient.  Lab Results  Component Value Date   TSH 4.060 04/20/2020   Lab Results  Component Value Date   WBC 8.8 05/03/2020   HGB 16.4 05/03/2020   HCT 46.9 05/03/2020   MCV 91.6 05/03/2020   PLT 252 05/03/2020   Lab Results  Component Value Date   NA 137 05/03/2020   K 3.8 05/03/2020   CO2 21 (L) 05/03/2020   GLUCOSE 146 (H) 05/03/2020   BUN 21 05/03/2020   CREATININE 1.08 05/03/2020   BILITOT 0.7 05/03/2020   ALKPHOS 73 05/03/2020   AST 33 05/03/2020   ALT 45 (H) 05/03/2020   PROT 8.0 05/03/2020   ALBUMIN 4.2 05/03/2020   CALCIUM 9.5 05/03/2020   ANIONGAP 12 05/03/2020   Lab Results  Component Value Date   CHOL 145 08/11/2020   Lab Results  Component Value Date   HDL 32 (L) 08/11/2020   Lab Results  Component Value Date   LDLCALC 68 08/11/2020   Lab Results  Component Value Date   TRIG 278 (H) 08/11/2020   Lab Results  Component Value Date   CHOLHDL 4.5 08/11/2020   Lab Results  Component Value Date   HGBA1C 5.6 05/12/2020      Assessment & Plan:   1. Essential hypertension His blood pressure is under his goal of 150/90. Continue monitoring BP at home and keeping a log. Increase exercise as tolerated with a goal of 150 mins per week Instructed to follow DASH diet. Encouraged on smoking  cessation. - metoprolol tartrate (LOPRESSOR) 50 MG tablet; Take 1 tablet (50 mg total) by mouth 2 (two) times daily.  Dispense: 60 tablet; Refill: 3  2. Chronic low back pain, unspecified back pain laterality, unspecified whether sciatica present Start medication as prescribed. Patient instructed to rest when able. Encouraged patient to only take medication prescribed to him and not use alcohol for pain control. Review side effects with patient and instructed patient not to drive or operate machinery on this medication. Instructed patient to seek emergency care for any new loss of sensation, loss of bowel or bladder control, or increased pain.  - cyclobenzaprine (FLEXERIL) 5 MG tablet; Take 1 tablet (5 mg total) by mouth 2 (two) times daily.  Dispense: 28 tablet; Refill: 0  3. Tobacco use disorder Smoking cessation encouraged. Patient not ready at this time.   4. History of gastroesophageal reflux (GERD) His GERD symptoms are under control.  -Avoid spicy, fatty and fried food -Avoid sodas and sour juices -Avoid heavy meals -Avoid eating 4 hours before bedtime -Elevate head of bed at night - omeprazole (PRILOSEC) 20 MG capsule; Take 1 capsule (20 mg total) by mouth daily.  Dispense: 30 capsule; Refill: 2  5. Mixed hyperlipidemia Patient restarted on his Crestor after over a month of noncompliance.  - rosuvastatin (CRESTOR) 5 MG tablet; Take 1 tablet (5 mg total) by mouth daily.  Dispense: 30 tablet; Refill: 3  6. Nasal congestion Start medication as ordered. Instructed to alert the clinic for increased pain or if a cough or fever develops. - guaiFENesin 200 MG tablet; Take 1 tablet (200 mg total)  by mouth every 4 (four) hours as needed for cough or to loosen phlegm.  Dispense: 30 suppository; Refill: 0  7. Health care maintenance Flu shot given today. Eye exam ordered and [facility] application provided.  - Flu Vaccine QUAD 6+ mos PF IM (Fluarix Quad PF) - Ambulatory referral to  Ophthalmology   Follow-up: Return in about 1 month (around 11/13/2020), or if symptoms worsen or fail to improve.    Marina Gravel, Student-NP

## 2020-11-11 ENCOUNTER — Ambulatory Visit: Payer: Medicaid Other | Admitting: Gerontology

## 2020-12-20 ENCOUNTER — Encounter: Payer: Self-pay | Admitting: Gerontology

## 2020-12-26 ENCOUNTER — Emergency Department
Admission: EM | Admit: 2020-12-26 | Discharge: 2020-12-26 | Disposition: A | Discharge status: Home / Self Care | Payer: Medicaid Other | Attending: Emergency Medicine | Admitting: Emergency Medicine

## 2020-12-26 ENCOUNTER — Encounter: Payer: Self-pay | Admitting: Emergency Medicine

## 2020-12-26 ENCOUNTER — Other Ambulatory Visit: Payer: Self-pay

## 2020-12-26 DIAGNOSIS — R21 Rash and other nonspecific skin eruption: Secondary | ICD-10-CM | POA: Insufficient documentation

## 2020-12-26 DIAGNOSIS — Z79899 Other long term (current) drug therapy: Secondary | ICD-10-CM | POA: Insufficient documentation

## 2020-12-26 DIAGNOSIS — F17200 Nicotine dependence, unspecified, uncomplicated: Secondary | ICD-10-CM | POA: Insufficient documentation

## 2020-12-26 DIAGNOSIS — E039 Hypothyroidism, unspecified: Secondary | ICD-10-CM | POA: Insufficient documentation

## 2020-12-26 DIAGNOSIS — I10 Essential (primary) hypertension: Secondary | ICD-10-CM | POA: Insufficient documentation

## 2020-12-26 MED ORDER — PREDNISONE 10 MG (21) PO TBPK
ORAL_TABLET | ORAL | 0 refills | Status: AC
Start: 2020-12-26 — End: ?

## 2020-12-26 MED ORDER — DEXAMETHASONE SODIUM PHOSPHATE 10 MG/ML IJ SOLN
10.0000 mg | Freq: Once | INTRAMUSCULAR | Status: AC
  Administered 2020-12-26: 10 mg via INTRAVENOUS
  Filled 2020-12-26: qty 1

## 2020-12-26 NOTE — ED Triage Notes (Signed)
Pt reports rash on his belly and "everywhere" for the last 2 days. Pt reports it itches like crazy

## 2020-12-26 NOTE — ED Triage Notes (Signed)
Pt admits that he smoked bad gast station weed 2 days ago before the rash started and then again today. Pt denies new soaps, detergents, foods, etc.

## 2020-12-26 NOTE — Discharge Instructions (Addendum)
[place] otc calamine lotion and benadryl to help with itching Return if worsening Use the prednisone as prescribed

## 2020-12-26 NOTE — ED Provider Notes (Signed)
[facility] Emergency Department Provider Note  ____________________________________________   Event Date/Time   First MD Initiated Contact with Patient 12/26/20 1632     (approximate)  I have reviewed the triage vital signs and the nursing notes.   HISTORY  Chief Complaint Rash    HPI Wayne Roy is a 65 y.o. male presents emergency department complaint of a rash although it is very itchy.  States that he smoked some bad gas station weed couple days ago and had left home to get away from his wife.  States he had stayed with a couple of friends have animals and slept in a Therapist, nutritional.  Patient states he did not see any bedbugs but felt like something Biting him.  He swatted many of them away.  Patient states the rash started yesterday and has gotten much worse today.  He did arrive by EMS.    Past Medical History:  Diagnosis Date  . Arthritis   . Bipolar 1 disorder ([place])   . Hypertension   . Hypothyroidism   . Myocardial infarction [facility])     Patient Active Problem List   Diagnosis Date Noted  . Nasal congestion 10/14/2020  . Impacted cerumen of left ear 07/22/2020  . Sinus pressure 07/22/2020  . Vision changes 06/16/2020  . Constipation 06/16/2020  . Neck pain, chronic 04/27/2020  . Dental decay 04/27/2020  . History of gastroesophageal reflux (GERD) 04/27/2020  . Psoriasis 04/27/2020  . Health care maintenance 04/27/2020  . Bipolar affective disorder, current episode hypomanic ([place]) 04/01/2018  . Cannabis abuse 04/01/2018  . Noncompliance 04/01/2018  . Hyperlipidemia 03/19/2018  . Medication monitoring encounter 03/19/2018  . Back pain 02/15/2017  . Bipolar disorder ([place]) 10/24/2016  . Tobacco use disorder 03/30/2016  . Essential hypertension 01/20/2016  . Hypothyroidism 01/20/2016  . Chest pain 01/20/2016    History reviewed. No pertinent surgical history.  Prior to Admission medications   Medication Sig Start Date End  Date Taking? Authorizing Provider  predniSONE (STERAPRED UNI-PAK 21 TAB) 10 MG (21) TBPK tablet Take 6 pills on day one then decrease by 1 pill each day 12/26/20  Yes Linken Mcglothen, Linden Dolin, PA-C  Blood Pressure KIT 1 kit by Does not apply route daily. 05/20/20   Iloabachie, Chioma E, NP  cariprazine (VRAYLAR) capsule Take 1.5 mg by mouth daily. Patient not taking: Reported on 06/16/2020    [provider]  cyclobenzaprine (FLEXERIL) 5 MG tablet Take 1 tablet (5 mg total) by mouth 2 (two) times daily. 10/14/20   Iloabachie, Chioma E, NP  guaiFENesin 200 MG tablet Take 1 tablet (200 mg total) by mouth every 4 (four) hours as needed for cough or to loosen phlegm. 10/14/20   Iloabachie, Chioma E, NP  metoprolol tartrate (LOPRESSOR) 50 MG tablet Take 1 tablet (50 mg total) by mouth 2 (two) times daily. 10/14/20 10/14/21  Iloabachie, Chioma E, NP  omeprazole (PRILOSEC) 20 MG capsule Take 1 capsule (20 mg total) by mouth daily. 10/14/20   Iloabachie, Chioma E, NP  rosuvastatin (CRESTOR) 5 MG tablet Take 1 tablet (5 mg total) by mouth daily. 10/14/20   Iloabachie, Chioma E, NP  sodium chloride (OCEAN) 0.65 % SOLN nasal spray Place 1 spray into both nostrils as needed for congestion. 07/22/20   Iloabachie, Chioma E, NP  triamcinolone cream (KENALOG) 0.1 % APPLY ONE APPLICATION TOPICALLY 2 TIMES A DAY. 09/07/20   Iloabachie, Chioma E, NP    Allergies Saphris [asenapine]  Family History  Problem  Relation Age of Onset  . Arthritis Mother   . Bipolar disorder Mother   . Hypertension Sister     Social History Social History   Tobacco Use  . Smoking status: Current Every Day Smoker    Packs/day: 0.50    Years: 45.00    Pack years: 22.50  . Smokeless tobacco: Never Used  . Tobacco comment: 1 pack every 3 days  Vaping Use  . Vaping Use: Never used  Substance Use Topics  . Alcohol use: No  . Drug use: No    Review of Systems  Constitutional: No fever/chills Eyes: No visual changes. ENT: No sore  throat. Respiratory: Denies cough Genitourinary: Negative for dysuria. Musculoskeletal: Negative for back pain. Skin: Positive for rash. Psychiatric: no mood changes,     ____________________________________________   PHYSICAL EXAM:  VITAL SIGNS: ED Triage Vitals  Enc Vitals Group     BP 12/26/20 1630 (!) 148/93     Pulse Rate 12/26/20 1630 (!) 101     Resp 12/26/20 1630 20     Temp 12/26/20 1629 98.2 F (36.8 C)     Temp Source 12/26/20 1629 Oral     SpO2 12/26/20 1630 99 %     Weight 12/26/20 1629 180 lb (81.6 kg)     Height 12/26/20 1629 5' 9"  (1.753 m)     Head Circumference --      Peak Flow --      Pain Score 12/26/20 1629 0     Pain Loc --      Pain Edu? --      Excl. in [place]? --     Constitutional: Alert and oriented. Well appearing and in no acute distress. Eyes: Conjunctivae are normal.  Head: Atraumatic. Nose: No congestion/rhinnorhea. Mouth/Throat: Mucous membranes are moist.  Neck:  supple no lymphadenopathy noted Cardiovascular: Normal rate, regular rhythm.  Respiratory: Normal respiratory effort.  No retractions, GU: deferred Musculoskeletal: FROM all extremities, warm and well perfused Neurologic:  Normal speech and language.  Skin:  Skin is warm, dry and intact.  Multiple bug bites noted on the patient's neck and trunk, no burrows noted in the webbing of the fingers, no vesicular type rashes noted  psychiatric: Mood and affect are normal. Speech and behavior are normal.  ____________________________________________   LABS (all labs ordered are listed, but only abnormal results are displayed)  Labs Reviewed - No data to display ____________________________________________   ____________________________________________  RADIOLOGY    ____________________________________________   PROCEDURES  Procedure(s) performed: No  Procedures    ____________________________________________   INITIAL IMPRESSION / ASSESSMENT AND PLAN / ED  COURSE  Pertinent labs & imaging results that were available during my care of the patient were reviewed by me and considered in my medical decision making (see chart for details).   Patient 65 year old male presents with rash.  See HPI.  Physical exam shows patient to have multiple bug bites all over his body.  Patient was given Decadron 10 mg IM.  A prescription for prednisone.  He is discharged stable condition.     SHERRY ROGUS was evaluated in Emergency Department on 12/26/2020 for the symptoms described in the history of present illness. He was evaluated in the context of the global COVID-19 pandemic, which necessitated consideration that the patient might be at risk for infection with the SARS-CoV-2 virus that causes COVID-19. Institutional protocols and algorithms that pertain to the evaluation of patients at risk for COVID-19 are in a state of rapid change based  on information released by regulatory bodies including the CDC and federal and state organizations. These policies and algorithms were followed during the patient's care in the ED.    As part of my medical decision making, I reviewed the following data within the [facility] notes reviewed and incorporated, Old chart reviewed, Notes from prior ED visits and [place] Controlled Substance Database  ____________________________________________   FINAL CLINICAL IMPRESSION(S) / ED DIAGNOSES  Final diagnoses:  Rash      NEW MEDICATIONS STARTED DURING THIS VISIT:  Discharge Medication List as of 12/26/2020  4:43 PM    START taking these medications   Details  predniSONE (STERAPRED UNI-PAK 21 TAB) 10 MG (21) TBPK tablet Take 6 pills on day one then decrease by 1 pill each day, Print         Note:  This document was prepared using Dragon voice recognition software and may include unintentional dictation errors.    Versie Starks, PA-C 12/26/20 1713    Delman Kitten, MD 12/26/20 [phone]

## 2020-12-26 NOTE — ED Triage Notes (Signed)
Pt in via EMS from home with c/o a rash. EMS reports pt had some bad gas station weed and wanted to get away from his wife.

## 2021-01-17 ENCOUNTER — Other Ambulatory Visit: Payer: Self-pay | Admitting: Gerontology

## 2021-01-17 DIAGNOSIS — L409 Psoriasis, unspecified: Secondary | ICD-10-CM

## 2021-03-10 ENCOUNTER — Other Ambulatory Visit: Payer: Self-pay | Admitting: Gerontology

## 2021-03-10 DIAGNOSIS — L409 Psoriasis, unspecified: Secondary | ICD-10-CM

## 2021-03-14 ENCOUNTER — Other Ambulatory Visit: Payer: Self-pay

## 2021-03-21 ENCOUNTER — Other Ambulatory Visit: Payer: Self-pay

## 2021-03-21 MED FILL — Triamcinolone Acetonide Cream 0.1%: CUTANEOUS | 30 days supply | Qty: 454 | Fill #0 | Status: CN

## 2021-03-23 ENCOUNTER — Other Ambulatory Visit: Payer: Self-pay

## 2021-05-03 ENCOUNTER — Other Ambulatory Visit: Payer: Self-pay

## 2021-06-30 ENCOUNTER — Other Ambulatory Visit: Payer: Self-pay

## 2021-11-24 ENCOUNTER — Other Ambulatory Visit: Payer: Self-pay

## 2022-01-18 NOTE — [facility] Nurse Program (Signed)
°  Dept: [facility] Nurse Program Note  Date of Encounter: 01/18/2022 Client to clinic for first aid to right hand. Scabs with surrounding erythema noted to the back of his hand on the first and second knuckles. Hand soaked in epsom salts and cleansed. Antibiotic ointment applied and covered with Band-Aid. Client to return for assessment again tomorrow.  Past Medical History: Past Medical History:  Diagnosis Date   Arthritis    Bipolar 1 disorder ([place])    Hypertension    Hypothyroidism    Myocardial infarction [facility])     Encounter Details:  CNP Questionnaire - 01/18/22 1122       Questionnaire   Do you give verbal consent to treat you today? Yes    Location Patient [place] or Organization    Patient Status Homeless    Insurance Uninsured ([employer]/Self-Pay)   family planning medicaid only   Insurance Referral N/A    Medication N/A    Medical Provider Yes   client ahs been seen at the [facility]   Screening Referrals N/A    Medical Referral N/A    Medical Appointment Made N/A    Food Have Food Insecurities    Transportation N/A   client uses [employer] bus system   Housing/Utilities No permanent housing    Interpersonal Safety N/A    Intervention Educate   first aid   ED Visit Averted N/A    Life-Saving Intervention Made N/A

## 2022-01-19 ENCOUNTER — Other Ambulatory Visit: Payer: Self-pay

## 2022-02-08 DEATH — deceased
